# Patient Record
Sex: Female | Born: 1955 | Race: White | Hispanic: No | Marital: Single | Smoking: Never smoker
Health system: Southern US, Community
[De-identification: ages and names within clinical notes are randomized; demographics above are authoritative.]

## PROBLEM LIST (undated history)

## (undated) DIAGNOSIS — T4145XA Adverse effect of unspecified anesthetic, initial encounter: Secondary | ICD-10-CM

## (undated) DIAGNOSIS — E039 Hypothyroidism, unspecified: Secondary | ICD-10-CM

## (undated) DIAGNOSIS — E785 Hyperlipidemia, unspecified: Secondary | ICD-10-CM

## (undated) DIAGNOSIS — S82409A Unspecified fracture of shaft of unspecified fibula, initial encounter for closed fracture: Secondary | ICD-10-CM

## (undated) DIAGNOSIS — R112 Nausea with vomiting, unspecified: Secondary | ICD-10-CM

## (undated) DIAGNOSIS — Z9889 Other specified postprocedural states: Secondary | ICD-10-CM

## (undated) DIAGNOSIS — T8859XA Other complications of anesthesia, initial encounter: Secondary | ICD-10-CM

---

## 2009-03-15 HISTORY — PX: REDUCTION MAMMAPLASTY: SUR839

## 2017-10-26 ENCOUNTER — Other Ambulatory Visit: Payer: Self-pay | Admitting: Family Medicine

## 2017-10-26 DIAGNOSIS — Z780 Asymptomatic menopausal state: Secondary | ICD-10-CM

## 2017-10-26 DIAGNOSIS — Z1231 Encounter for screening mammogram for malignant neoplasm of breast: Secondary | ICD-10-CM

## 2017-12-15 ENCOUNTER — Encounter: Payer: Self-pay | Admitting: Radiology

## 2017-12-15 ENCOUNTER — Ambulatory Visit
Admission: RE | Admit: 2017-12-15 | Discharge: 2017-12-15 | Disposition: A | Discharge status: Home / Self Care | Payer: BLUE CROSS/BLUE SHIELD | Source: Ambulatory Visit | Attending: Family Medicine | Admitting: Family Medicine

## 2017-12-15 DIAGNOSIS — Z780 Asymptomatic menopausal state: Secondary | ICD-10-CM

## 2017-12-15 DIAGNOSIS — Z1231 Encounter for screening mammogram for malignant neoplasm of breast: Secondary | ICD-10-CM

## 2018-04-21 DIAGNOSIS — S82409A Unspecified fracture of shaft of unspecified fibula, initial encounter for closed fracture: Secondary | ICD-10-CM

## 2018-04-21 HISTORY — PX: INCISION AND DRAINAGE: SHX5863

## 2018-04-21 HISTORY — DX: Unspecified fracture of shaft of unspecified fibula, initial encounter for closed fracture: S82.409A

## 2018-04-24 HISTORY — PX: FIBULA FRACTURE SURGERY: SHX947

## 2018-05-08 ENCOUNTER — Ambulatory Visit (INDEPENDENT_AMBULATORY_CARE_PROVIDER_SITE_OTHER): Payer: BLUE CROSS/BLUE SHIELD | Admitting: Plastic Surgery

## 2018-05-08 ENCOUNTER — Encounter: Payer: Self-pay | Admitting: Plastic Surgery

## 2018-05-08 VITALS — BP 121/82 | HR 65 | Temp 97.9°F | Ht 64.0 in | Wt 115.0 lb

## 2018-05-08 DIAGNOSIS — W5512XA Struck by horse, initial encounter: Secondary | ICD-10-CM

## 2018-05-08 DIAGNOSIS — S81802A Unspecified open wound, left lower leg, initial encounter: Secondary | ICD-10-CM | POA: Diagnosis not present

## 2018-05-08 NOTE — Progress Notes (Signed)
Patient ID: Joyce Bass, female    DOB: 04-Nov-1955, 63 y.o.   MRN: [id number]   Chief Complaint  Patient presents with  . Advice Only    for (L) lower leg wound  . Skin Problem    The patient is a 63 yrs old wf here for evaulation of her left leg after being stepped on by a horse. This occurred last week.   She was seen directly after the accident and treated.  Multiple sutures were applied.  She also had a fracture that was repaired from the lateral incision.  She has some loss of soft tissue and skin with necrosis noted.  It is not fully clear how much loss will be the final result.  She does not appear to be infected.  She has been treating it with a clean dressing.  She is otherwise in good health.  She has a lot of experience around horses and said the this was an accident.   Review of Systems  Constitutional: Positive for activity change. Negative for appetite change.  HENT: Negative.   Eyes: Negative.   Respiratory: Negative.   Cardiovascular: Negative.   Gastrointestinal: Negative.   Endocrine: Negative.   Genitourinary: Negative.   Musculoskeletal:       Swelling due to accident  Hematological: Negative.   Psychiatric/Behavioral: Negative.     Past Medical History:  Diagnosis Date  . Complication of anesthesia   . Fibula fracture 04/21/2018   left-horse fell on leg  . Hyperlipidemia   . Hypothyroidism    hashimoto's  . PONV (postoperative nausea and vomiting)     Past Surgical History:  Procedure Laterality Date  . FIBULA FRACTURE SURGERY Left 04/24/2018  . INCISION AND DRAINAGE Left 04/21/2018   left lef  . REDUCTION MAMMAPLASTY Bilateral 2011      Current Outpatient Medications:  .  atorvastatin (LIPITOR) 10 MG tablet, Take 1 tablet by mouth at bedtime., Disp: , Rfl:  .  acetaminophen (TYLENOL) 325 MG tablet, Take 650 mg by mouth every 6 (six) hours as needed., Disp: , Rfl:  .  Calcium Alginate-Silver (ANTIBACTERIAL ALGINATE W/SILV) 4.25"X4.25"  PADS, Apply 1 application topically daily for 12 days., Disp: 12 each, Rfl: 1 .  calcium-vitamin D (OSCAL WITH D) 500-200 MG-UNIT tablet, Take 1 tablet by mouth., Disp: , Rfl:  .  estradiol-norethindrone (ACTIVELLA) 1-0.5 MG tablet, Take 1 tablet by mouth daily., Disp: , Rfl:  .  levothyroxine (SYNTHROID, LEVOTHROID) 88 MCG tablet, Take 88 mcg by mouth daily before breakfast., Disp: , Rfl:  .  Multiple Vitamin (MULTIVITAMIN WITH MINERALS) TABS tablet, Take 1 tablet by mouth daily., Disp: , Rfl:  .  naproxen sodium (ALEVE) 220 MG tablet, Take 220 mg by mouth., Disp: , Rfl:  .  vitamin C (ASCORBIC ACID) 500 MG tablet, Take 500 mg by mouth daily., Disp: , Rfl:    Objective:   Vitals:   05/08/18 1322  BP: 121/82  Pulse: 65  Temp: 97.9 F (36.6 C)  SpO2: 99%    Physical Exam Vitals signs and nursing note reviewed.  Constitutional:      Appearance: Normal appearance.  HENT:     Head: Normocephalic.     Nose: Nose normal.     Mouth/Throat:     Mouth: Mucous membranes are moist.  Cardiovascular:     Rate and Rhythm: Normal rate.     Pulses: Normal pulses.  Pulmonary:     Effort: Pulmonary effort is normal. No respiratory  distress.  Musculoskeletal:       Legs:  Skin:    General: Skin is warm.  Neurological:     General: No focal deficit present.     Mental Status: She is alert.  Psychiatric:        Mood and Affect: Mood normal.        Thought Content: Thought content normal.        Judgment: Judgment normal.     Assessment & Plan:  Struck by horse, initial encounter  Leg wound, left, initial encounter   Recommend excision of nonviable skin and soft tissue of the left leg.  Placement of ACell and likely the VAC.  The patient is in agreement.  In the meantime she can continue using the dressings or a silver-based dressing.  Keep leg elevated as able.  We tried a silver sock but it was too tight.  Alena Bills Mohsen Odenthal, DO

## 2018-05-08 NOTE — H&P (View-Only) (Signed)
   Patient ID: Joyce Bass, female    DOB: 04/25/1955, 62 y.o.   MRN: [id number]   Chief Complaint  Patient presents with  . Advice Only    for (L) lower leg wound  . Skin Problem    The patient is a 62 yrs old wf here for evaulation of her left leg after being stepped on by a horse. This occurred last week.   She was seen directly after the accident and treated.  Multiple sutures were applied.  She also had a fracture that was repaired from the lateral incision.  She has some loss of soft tissue and skin with necrosis noted.  It is not fully clear how much loss will be the final result.  She does not appear to be infected.  She has been treating it with a clean dressing.  She is otherwise in good health.  She has a lot of experience around horses and said the this was an accident.   Review of Systems  Constitutional: Positive for activity change. Negative for appetite change.  HENT: Negative.   Eyes: Negative.   Respiratory: Negative.   Cardiovascular: Negative.   Gastrointestinal: Negative.   Endocrine: Negative.   Genitourinary: Negative.   Musculoskeletal:       Swelling due to accident  Hematological: Negative.   Psychiatric/Behavioral: Negative.     Past Medical History:  Diagnosis Date  . Complication of anesthesia   . Fibula fracture 04/21/2018   left-horse fell on leg  . Hyperlipidemia   . Hypothyroidism    hashimoto's  . PONV (postoperative nausea and vomiting)     Past Surgical History:  Procedure Laterality Date  . FIBULA FRACTURE SURGERY Left 04/24/2018  . INCISION AND DRAINAGE Left 04/21/2018   left lef  . REDUCTION MAMMAPLASTY Bilateral 2011      Current Outpatient Medications:  .  atorvastatin (LIPITOR) 10 MG tablet, Take 1 tablet by mouth at bedtime., Disp: , Rfl:  .  acetaminophen (TYLENOL) 325 MG tablet, Take 650 mg by mouth every 6 (six) hours as needed., Disp: , Rfl:  .  Calcium Alginate-Silver (ANTIBACTERIAL ALGINATE W/SILV) 4.25"X4.25"  PADS, Apply 1 application topically daily for 12 days., Disp: 12 each, Rfl: 1 .  calcium-vitamin D (OSCAL WITH D) 500-200 MG-UNIT tablet, Take 1 tablet by mouth., Disp: , Rfl:  .  estradiol-norethindrone (ACTIVELLA) 1-0.5 MG tablet, Take 1 tablet by mouth daily., Disp: , Rfl:  .  levothyroxine (SYNTHROID, LEVOTHROID) 88 MCG tablet, Take 88 mcg by mouth daily before breakfast., Disp: , Rfl:  .  Multiple Vitamin (MULTIVITAMIN WITH MINERALS) TABS tablet, Take 1 tablet by mouth daily., Disp: , Rfl:  .  naproxen sodium (ALEVE) 220 MG tablet, Take 220 mg by mouth., Disp: , Rfl:  .  vitamin C (ASCORBIC ACID) 500 MG tablet, Take 500 mg by mouth daily., Disp: , Rfl:    Objective:   Vitals:   05/08/18 1322  BP: 121/82  Pulse: 65  Temp: 97.9 F (36.6 C)  SpO2: 99%    Physical Exam Vitals signs and nursing note reviewed.  Constitutional:      Appearance: Normal appearance.  HENT:     Head: Normocephalic.     Nose: Nose normal.     Mouth/Throat:     Mouth: Mucous membranes are moist.  Cardiovascular:     Rate and Rhythm: Normal rate.     Pulses: Normal pulses.  Pulmonary:     Effort: Pulmonary effort is normal. No respiratory   distress.  Musculoskeletal:       Legs:  Skin:    General: Skin is warm.  Neurological:     General: No focal deficit present.     Mental Status: She is alert.  Psychiatric:        Mood and Affect: Mood normal.        Thought Content: Thought content normal.        Judgment: Judgment normal.     Assessment & Plan:  Struck by horse, initial encounter  Leg wound, left, initial encounter   Recommend excision of nonviable skin and soft tissue of the left leg.  Placement of ACell and likely the VAC.  The patient is in agreement.  In the meantime she can continue using the dressings or a silver-based dressing.  Keep leg elevated as able.  We tried a silver sock but it was too tight.  Joyce Bills Kasson Lamere, DO

## 2018-05-10 ENCOUNTER — Telehealth: Payer: Self-pay | Admitting: Plastic Surgery

## 2018-05-10 NOTE — Telephone Encounter (Signed)
Patient called inquiring about how to care for wound. Patient states she is now caring for the wound by herself and is not sure what to do. Patient also requesting call to schedule surgery, as she would want the surgery as soon as possible.

## 2018-05-15 ENCOUNTER — Encounter: Payer: Self-pay | Admitting: Plastic Surgery

## 2018-05-15 ENCOUNTER — Other Ambulatory Visit: Payer: Self-pay

## 2018-05-15 ENCOUNTER — Encounter (HOSPITAL_BASED_OUTPATIENT_CLINIC_OR_DEPARTMENT_OTHER): Payer: Self-pay | Admitting: *Deleted

## 2018-05-15 DIAGNOSIS — W5512XA Struck by horse, initial encounter: Secondary | ICD-10-CM | POA: Insufficient documentation

## 2018-05-15 DIAGNOSIS — S81802A Unspecified open wound, left lower leg, initial encounter: Secondary | ICD-10-CM | POA: Insufficient documentation

## 2018-05-15 MED ORDER — "ANTIBACTERIAL ALGINATE W/SILV 4.25""X4.25"" EX PADS": 1.0000 "application " | MEDICATED_PAD | Freq: Every day | CUTANEOUS | 1 refills | Status: AC

## 2018-05-15 NOTE — Telephone Encounter (Signed)
Patient called after visiting [facility]. Patient states pharmacy does not have prescription for silver bandaging. Patient asking for same to be called in.

## 2018-05-17 NOTE — Anesthesia Preprocedure Evaluation (Addendum)
Anesthesia Evaluation  Patient identified by MRN, date of birth, ID band Patient awake    Reviewed: Allergy & Precautions, H&P , NPO status , Patient's Chart, lab work & pertinent test results  History of Anesthesia Complications (+) PONV  Airway Mallampati: I  TM Distance: >3 FB Neck ROM: Full    Dental no notable dental hx. (+) Teeth Intact, Dental Advisory Given   Pulmonary neg pulmonary ROS,    Pulmonary exam normal breath sounds clear to auscultation       Cardiovascular Exercise Tolerance: Good Normal cardiovascular exam Rhythm:Regular Rate:Normal     Neuro/Psych negative neurological ROS  negative psych ROS   GI/Hepatic negative GI ROS, Neg liver ROS,   Endo/Other  Hypothyroidism   Renal/GU negative Renal ROS     Musculoskeletal   Abdominal   Peds  Hematology   Anesthesia Other Findings   Reproductive/Obstetrics                            Anesthesia Physical Anesthesia Plan  ASA: II  Anesthesia Plan: General   Post-op Pain Management:    Induction:   PONV Risk Score and Plan: 4 or greater and Treatment may vary due to age or medical condition, Scopolamine patch - Pre-op, Dexamethasone and Ondansetron  Airway Management Planned: LMA  Additional Equipment:   Intra-op Plan:   Post-operative Plan:   Informed Consent: I have reviewed the patients History and Physical, chart, labs and discussed the procedure including the risks, benefits and alternatives for the proposed anesthesia with the patient or authorized representative who has indicated his/her understanding and acceptance.     Dental advisory given  Plan Discussed with:   Anesthesia Plan Comments: (LMA)       Anesthesia Quick Evaluation

## 2018-05-18 ENCOUNTER — Ambulatory Visit (HOSPITAL_BASED_OUTPATIENT_CLINIC_OR_DEPARTMENT_OTHER): Payer: BLUE CROSS/BLUE SHIELD | Admitting: Anesthesiology

## 2018-05-18 ENCOUNTER — Other Ambulatory Visit: Payer: Self-pay

## 2018-05-18 ENCOUNTER — Encounter (HOSPITAL_BASED_OUTPATIENT_CLINIC_OR_DEPARTMENT_OTHER): Payer: Self-pay | Admitting: *Deleted

## 2018-05-18 ENCOUNTER — Encounter (HOSPITAL_BASED_OUTPATIENT_CLINIC_OR_DEPARTMENT_OTHER)
Admission: RE | Disposition: A | Discharge status: Home / Self Care | Payer: Self-pay | Source: Home / Self Care | Attending: Plastic Surgery

## 2018-05-18 ENCOUNTER — Ambulatory Visit (HOSPITAL_COMMUNITY)
Admission: RE | Admit: 2018-05-18 | Discharge: 2018-05-18 | Disposition: A | Discharge status: Home / Self Care | Payer: BLUE CROSS/BLUE SHIELD | Attending: Plastic Surgery | Admitting: Plastic Surgery

## 2018-05-18 DIAGNOSIS — W5519XD Other contact with horse, subsequent encounter: Secondary | ICD-10-CM | POA: Insufficient documentation

## 2018-05-18 DIAGNOSIS — S81802A Unspecified open wound, left lower leg, initial encounter: Secondary | ICD-10-CM

## 2018-05-18 DIAGNOSIS — E785 Hyperlipidemia, unspecified: Secondary | ICD-10-CM | POA: Insufficient documentation

## 2018-05-18 DIAGNOSIS — E063 Autoimmune thyroiditis: Secondary | ICD-10-CM | POA: Insufficient documentation

## 2018-05-18 DIAGNOSIS — Z79899 Other long term (current) drug therapy: Secondary | ICD-10-CM | POA: Insufficient documentation

## 2018-05-18 DIAGNOSIS — W5582XA Struck by other mammals, initial encounter: Secondary | ICD-10-CM

## 2018-05-18 DIAGNOSIS — S81802D Unspecified open wound, left lower leg, subsequent encounter: Secondary | ICD-10-CM | POA: Insufficient documentation

## 2018-05-18 HISTORY — DX: Hyperlipidemia, unspecified: E78.5

## 2018-05-18 HISTORY — DX: Other specified postprocedural states: Z98.890

## 2018-05-18 HISTORY — DX: Adverse effect of unspecified anesthetic, initial encounter: T41.45XA

## 2018-05-18 HISTORY — DX: Unspecified fracture of shaft of unspecified fibula, initial encounter for closed fracture: S82.409A

## 2018-05-18 HISTORY — DX: Hypothyroidism, unspecified: E03.9

## 2018-05-18 HISTORY — DX: Other complications of anesthesia, initial encounter: T88.59XA

## 2018-05-18 HISTORY — DX: Other specified postprocedural states: R11.2

## 2018-05-18 HISTORY — PX: I & D EXTREMITY: SHX5045

## 2018-05-18 SURGERY — IRRIGATION AND DEBRIDEMENT EXTREMITY
Anesthesia: General | Site: Leg Lower | Laterality: Left

## 2018-05-18 MED ORDER — SODIUM CHLORIDE 0.9 % IV SOLN
INTRAVENOUS | Status: AC
  Filled 2018-05-18: qty 500000

## 2018-05-18 MED ORDER — FENTANYL CITRATE (PF) 100 MCG/2ML IJ SOLN
50.0000 ug | INTRAMUSCULAR | Status: DC | PRN
  Administered 2018-05-18: 50 ug via INTRAVENOUS

## 2018-05-18 MED ORDER — PROMETHAZINE HCL 25 MG/ML IJ SOLN: 6.2500 mg | INTRAMUSCULAR | Status: DC | PRN

## 2018-05-18 MED ORDER — SODIUM CHLORIDE 0.9 % IV SOLN: 250.0000 mL | INTRAVENOUS | Status: DC | PRN

## 2018-05-18 MED ORDER — LACTATED RINGERS IV SOLN
INTRAVENOUS | Status: DC
  Administered 2018-05-18 (×2): via INTRAVENOUS

## 2018-05-18 MED ORDER — ACETAMINOPHEN 650 MG RE SUPP: 650.0000 mg | RECTAL | Status: DC | PRN

## 2018-05-18 MED ORDER — LIDOCAINE HCL (CARDIAC) PF 100 MG/5ML IV SOSY
PREFILLED_SYRINGE | INTRAVENOUS | Status: DC | PRN
  Administered 2018-05-18: 40 mg via INTRAVENOUS

## 2018-05-18 MED ORDER — HYDROCODONE-ACETAMINOPHEN 7.5-325 MG PO TABS: 1.0000 | ORAL_TABLET | Freq: Once | ORAL | Status: DC | PRN

## 2018-05-18 MED ORDER — SODIUM CHLORIDE 0.9% FLUSH: 3.0000 mL | Freq: Two times a day (BID) | INTRAVENOUS | Status: DC

## 2018-05-18 MED ORDER — MIDAZOLAM HCL 2 MG/2ML IJ SOLN
INTRAMUSCULAR | Status: AC
  Filled 2018-05-18: qty 2

## 2018-05-18 MED ORDER — MIDAZOLAM HCL 2 MG/2ML IJ SOLN
1.0000 mg | INTRAMUSCULAR | Status: DC | PRN
  Administered 2018-05-18: 2 mg via INTRAVENOUS

## 2018-05-18 MED ORDER — ACETAMINOPHEN 325 MG PO TABS: 650.0000 mg | ORAL_TABLET | ORAL | Status: DC | PRN

## 2018-05-18 MED ORDER — PROPOFOL 10 MG/ML IV BOLUS
INTRAVENOUS | Status: DC | PRN
  Administered 2018-05-18: 120 mg via INTRAVENOUS

## 2018-05-18 MED ORDER — ACETAMINOPHEN 10 MG/ML IV SOLN: 1000.0000 mg | Freq: Once | INTRAVENOUS | Status: DC | PRN

## 2018-05-18 MED ORDER — HYDROMORPHONE HCL 1 MG/ML IJ SOLN: 0.2500 mg | INTRAMUSCULAR | Status: DC | PRN

## 2018-05-18 MED ORDER — PHENYLEPHRINE HCL 10 MG/ML IJ SOLN
INTRAMUSCULAR | Status: DC | PRN
  Administered 2018-05-18: 40 ug via INTRAVENOUS

## 2018-05-18 MED ORDER — MEPERIDINE HCL 25 MG/ML IJ SOLN: 6.2500 mg | INTRAMUSCULAR | Status: DC | PRN

## 2018-05-18 MED ORDER — CEFAZOLIN SODIUM-DEXTROSE 2-4 GM/100ML-% IV SOLN
2.0000 g | INTRAVENOUS | Status: AC
  Administered 2018-05-18: 2 g via INTRAVENOUS

## 2018-05-18 MED ORDER — PROPOFOL 10 MG/ML IV BOLUS
INTRAVENOUS | Status: AC
  Filled 2018-05-18: qty 20

## 2018-05-18 MED ORDER — LIDOCAINE 2% (20 MG/ML) 5 ML SYRINGE
INTRAMUSCULAR | Status: AC
  Filled 2018-05-18: qty 5

## 2018-05-18 MED ORDER — SCOPOLAMINE 1 MG/3DAYS TD PT72
1.0000 | MEDICATED_PATCH | Freq: Once | TRANSDERMAL | Status: AC | PRN
  Administered 2018-05-18: 1 via TRANSDERMAL

## 2018-05-18 MED ORDER — OXYCODONE HCL 5 MG PO TABS: 5.0000 mg | ORAL_TABLET | ORAL | Status: DC | PRN

## 2018-05-18 MED ORDER — ONDANSETRON HCL 4 MG/2ML IJ SOLN
INTRAMUSCULAR | Status: DC | PRN
  Administered 2018-05-18: 4 mg via INTRAVENOUS

## 2018-05-18 MED ORDER — ONDANSETRON HCL 4 MG/2ML IJ SOLN
INTRAMUSCULAR | Status: AC
  Filled 2018-05-18: qty 2

## 2018-05-18 MED ORDER — SCOPOLAMINE 1 MG/3DAYS TD PT72: 1.0000 | MEDICATED_PATCH | TRANSDERMAL | Status: DC

## 2018-05-18 MED ORDER — CEFAZOLIN SODIUM-DEXTROSE 2-4 GM/100ML-% IV SOLN
INTRAVENOUS | Status: AC
  Filled 2018-05-18: qty 100

## 2018-05-18 MED ORDER — SODIUM CHLORIDE 0.9 % IV SOLN
INTRAVENOUS | Status: DC | PRN
  Administered 2018-05-18: 500 mL

## 2018-05-18 MED ORDER — HYDROCODONE-ACETAMINOPHEN 5-325 MG PO TABS: 1.0000 | ORAL_TABLET | Freq: Four times a day (QID) | ORAL | 0 refills | Status: AC | PRN

## 2018-05-18 MED ORDER — SODIUM CHLORIDE 0.9% FLUSH: 3.0000 mL | INTRAVENOUS | Status: DC | PRN

## 2018-05-18 MED ORDER — DEXAMETHASONE SODIUM PHOSPHATE 10 MG/ML IJ SOLN
INTRAMUSCULAR | Status: AC
  Filled 2018-05-18: qty 1

## 2018-05-18 MED ORDER — DEXAMETHASONE SODIUM PHOSPHATE 4 MG/ML IJ SOLN
INTRAMUSCULAR | Status: DC | PRN
  Administered 2018-05-18: 4 mg via INTRAVENOUS

## 2018-05-18 MED ORDER — FENTANYL CITRATE (PF) 100 MCG/2ML IJ SOLN
INTRAMUSCULAR | Status: AC
  Filled 2018-05-18: qty 2

## 2018-05-18 SURGICAL SUPPLY — 78 items
BAG DECANTER FOR FLEXI CONT (MISCELLANEOUS) ×2 IMPLANT
BANDAGE ACE 3X5.8 VEL STRL LF (GAUZE/BANDAGES/DRESSINGS) ×2 IMPLANT
BANDAGE ACE 4X5 VEL STRL LF (GAUZE/BANDAGES/DRESSINGS) ×2 IMPLANT
BANDAGE ACE 6X5 VEL STRL LF (GAUZE/BANDAGES/DRESSINGS) IMPLANT
BENZOIN TINCTURE PRP APPL 2/3 (GAUZE/BANDAGES/DRESSINGS) IMPLANT
BLADE HEX COATED 2.75 (ELECTRODE) ×2 IMPLANT
BLADE MINI RND TIP GREEN BEAV (BLADE) IMPLANT
BLADE SURG 10 STRL SS (BLADE) ×2 IMPLANT
BLADE SURG 15 STRL LF DISP TIS (BLADE) ×1 IMPLANT
BLADE SURG 15 STRL SS (BLADE) ×1
BNDG COHESIVE 4X5 TAN STRL (GAUZE/BANDAGES/DRESSINGS) IMPLANT
BNDG GAUZE ELAST 4 BULKY (GAUZE/BANDAGES/DRESSINGS) ×4 IMPLANT
CANISTER SUCT 1200ML W/VALVE (MISCELLANEOUS) ×2 IMPLANT
CORD BIPOLAR FORCEPS 12FT (ELECTRODE) IMPLANT
COVER BACK TABLE 60X90IN (DRAPES) ×2 IMPLANT
COVER MAYO STAND STRL (DRAPES) ×2 IMPLANT
COVER WAND RF STERILE (DRAPES) IMPLANT
DECANTER SPIKE VIAL GLASS SM (MISCELLANEOUS) IMPLANT
DERMABOND ADVANCED (GAUZE/BANDAGES/DRESSINGS)
DERMABOND ADVANCED .7 DNX12 (GAUZE/BANDAGES/DRESSINGS) IMPLANT
DRAIN PENROSE 1/2X12 LTX STRL (WOUND CARE) IMPLANT
DRAPE INCISE IOBAN 66X45 STRL (DRAPES) IMPLANT
DRAPE U-SHAPE 76X120 STRL (DRAPES) ×2 IMPLANT
DRESSING DUODERM 4X4 STERILE (GAUZE/BANDAGES/DRESSINGS) IMPLANT
DRSG ADAPTIC 3X8 NADH LF (GAUZE/BANDAGES/DRESSINGS) IMPLANT
DRSG EMULSION OIL 3X3 NADH (GAUZE/BANDAGES/DRESSINGS) IMPLANT
DRSG PAD ABDOMINAL 8X10 ST (GAUZE/BANDAGES/DRESSINGS) ×2 IMPLANT
ELECT NEEDLE TIP 2.8 STRL (NEEDLE) IMPLANT
ELECT REM PT RETURN 9FT ADLT (ELECTROSURGICAL) ×2
ELECTRODE REM PT RTRN 9FT ADLT (ELECTROSURGICAL) ×1 IMPLANT
GAUZE SPONGE 4X4 12PLY STRL (GAUZE/BANDAGES/DRESSINGS) ×2 IMPLANT
GAUZE SPONGE 4X4 12PLY STRL LF (GAUZE/BANDAGES/DRESSINGS) IMPLANT
GLOVE BIO SURGEON STRL SZ 6.5 (GLOVE) ×6 IMPLANT
GLOVE BIOGEL PI IND STRL 7.0 (GLOVE) ×1 IMPLANT
GLOVE BIOGEL PI INDICATOR 7.0 (GLOVE) ×1
GOWN STRL REUS W/ TWL LRG LVL3 (GOWN DISPOSABLE) ×2 IMPLANT
GOWN STRL REUS W/ TWL XL LVL3 (GOWN DISPOSABLE) ×1 IMPLANT
GOWN STRL REUS W/TWL LRG LVL3 (GOWN DISPOSABLE) ×2
GOWN STRL REUS W/TWL XL LVL3 (GOWN DISPOSABLE) ×1
MATRIX WOUND MESHED 2X2 (Tissue) ×1 IMPLANT
NEEDLE HYPO 25X1 1.5 SAFETY (NEEDLE) IMPLANT
NEEDLE PRECISIONGLIDE 27X1.5 (NEEDLE) IMPLANT
NS IRRIG 1000ML POUR BTL (IV SOLUTION) ×2 IMPLANT
PACK BASIN DAY SURGERY FS (CUSTOM PROCEDURE TRAY) ×2 IMPLANT
PADDING CAST ABS 3INX4YD NS (CAST SUPPLIES)
PADDING CAST ABS 4INX4YD NS (CAST SUPPLIES)
PADDING CAST ABS COTTON 3X4 (CAST SUPPLIES) IMPLANT
PADDING CAST ABS COTTON 4X4 ST (CAST SUPPLIES) IMPLANT
PENCIL BUTTON HOLSTER BLD 10FT (ELECTRODE) ×2 IMPLANT
SHEET MEDIUM DRAPE 40X70 STRL (DRAPES) ×2 IMPLANT
SLEEVE SCD COMPRESS KNEE MED (MISCELLANEOUS) ×2 IMPLANT
SPLINT FIBERGLASS 3X35 (CAST SUPPLIES) ×2 IMPLANT
SPLINT FIBERGLASS 4X30 (CAST SUPPLIES) IMPLANT
SPLINT PLASTER CAST XFAST 3X15 (CAST SUPPLIES) IMPLANT
SPLINT PLASTER XTRA FASTSET 3X (CAST SUPPLIES)
SPONGE LAP 18X18 RF (DISPOSABLE) ×2 IMPLANT
STAPLER VISISTAT 35W (STAPLE) IMPLANT
STOCKINETTE 4X48 STRL (DRAPES) IMPLANT
STOCKINETTE 6  STRL (DRAPES)
STOCKINETTE 6 STRL (DRAPES) IMPLANT
STOCKINETTE IMPERVIOUS LG (DRAPES) IMPLANT
SUCTION FRAZIER HANDLE 10FR (MISCELLANEOUS)
SUCTION TUBE FRAZIER 10FR DISP (MISCELLANEOUS) IMPLANT
SURGILUBE 2OZ TUBE FLIPTOP (MISCELLANEOUS) ×2 IMPLANT
SUT MON AB 5-0 PS2 18 (SUTURE) ×2 IMPLANT
SUT SILK 3 0 PS 1 (SUTURE) IMPLANT
SUT VIC AB 3-0 FS2 27 (SUTURE) IMPLANT
SUT VIC AB 5-0 P-3 18X BRD (SUTURE) IMPLANT
SUT VIC AB 5-0 P3 18 (SUTURE)
SUT VIC AB 5-0 PS2 18 (SUTURE) ×4 IMPLANT
SYR BULB IRRIGATION 50ML (SYRINGE) ×2 IMPLANT
SYR CONTROL 10ML LL (SYRINGE) IMPLANT
TOWEL GREEN STERILE FF (TOWEL DISPOSABLE) ×2 IMPLANT
TRAY DSU PREP LF (CUSTOM PROCEDURE TRAY) ×2 IMPLANT
TUBE CONNECTING 20X1/4 (TUBING) ×2 IMPLANT
UNDERPAD 30X30 (UNDERPADS AND DIAPERS) ×2 IMPLANT
WOUND MATRIX MESHED 2X2 (Tissue) ×1 IMPLANT
YANKAUER SUCT BULB TIP NO VENT (SUCTIONS) ×2 IMPLANT

## 2018-05-18 NOTE — Interval H&P Note (Signed)
History and Physical Interval Note:  05/18/2018 3:10 PM  Joyce Bass  has presented today for surgery, with the diagnosis of left leg wound  The various methods of treatment have been discussed with the patient and family. After consideration of risks, benefits and other options for treatment, the patient has consented to  Procedure(s) with comments: Excision of left leg wound (Left) - adjust case length to 45 min, please with Acell (Left) and VAC placement (Left) as a surgical intervention .  The patient's history has been reviewed, patient examined, no change in status, stable for surgery.  I have reviewed the patient's chart and labs.  Questions were answered to the patient's satisfaction.     Alena Bills Dillingham

## 2018-05-18 NOTE — Op Note (Signed)
DATE OF OPERATION: 05/18/2018  LOCATION: [facility]  PREOPERATIVE DIAGNOSIS: Left leg wound from horse kick 7 x 10 cm  POSTOPERATIVE DIAGNOSIS: Same  PROCEDURE: excision of left leg wound 5 x 5 cm skin and soft tissue with placement of Integra 5 x 5 cm  SURGEON: Claire Sanger Dillingham, DO  EBL: 5 cc  CONDITION: Stable  COMPLICATIONS: None  INDICATION: The patient, Joyce Bass, is a 63 y.o. female born on July 13, 1955, is here for treatment of a crush injury to the left leg.   PROCEDURE DETAILS:  The patient was seen prior to surgery and marked.  The IV antibiotics were given. The patient was taken to the operating room and given a general anesthetic. A standard time out was performed and all information was confirmed by those in the room. SCD was placed on the right leg.   The left leg was prepped and draped.  The #10 blade was used to excise the nonviable skin and soft tissue of the 5 x 5 cm area.  The wound was full thickness.  The wound was irrigated with antiobiotic solution and saline. The 5 x 5 cm integra sheet was applied and secured with the 5-0 Vicryl.  The sutures that were placed previously were removed.  Intermittent 4-0 Monocryl were placed to prevent opening of the wound. [place] was applied with 4 x 4 cm gauze.  The leg was wrapped with kerlex and an ace wrap.  The patient was allowed to wake up and taken to recovery room in stable condition at the end of the case. The family was notified at the end of the case.

## 2018-05-18 NOTE — Anesthesia Postprocedure Evaluation (Signed)
Anesthesia Post Note  Patient: Shellee Bisson  Procedure(s) Performed: Debridement of left lower leg wound with placement of Integra (Left Leg Lower)     Patient location during evaluation: PACU Anesthesia Type: General Level of consciousness: awake and alert Pain management: pain level controlled Vital Signs Assessment: post-procedure vital signs reviewed and stable Respiratory status: spontaneous breathing, nonlabored ventilation, respiratory function stable and patient connected to nasal cannula oxygen Cardiovascular status: blood pressure returned to baseline and stable Postop Assessment: no apparent nausea or vomiting Anesthetic complications: no    Last Vitals:  Vitals:   05/18/18 1630 05/18/18 1710  BP: (!) 143/90 (!) 145/81  Pulse: 72 (!) 53  Resp: 18 16  Temp:  36.6 C  SpO2: 100% 99%    Last Pain:  Vitals:   05/18/18 1710  TempSrc:   PainSc: 1                  Trevor Iha

## 2018-05-18 NOTE — Discharge Instructions (Addendum)
INSTRUCTIONS FOR AFTER SURGERY   You are having surgery.  You will likely have some questions about what to expect following your operation.  The following information will help you and your family understand what to expect when you are discharged from the hospital.  Following these guidelines will help ensure a smooth recovery and reduce risks of complications.   Postoperative instructions include information on: diet, wound care, medications and physical activity.  AFTER SURGERY Expect to go home after the procedure.  In some cases, you may need to spend one night in the hospital for observation.  DIET This surgery does not require a specific diet.  However, I have to mention that the healthier you eat the better your body can start healing. It is important to increasing your protein intake.  This means limiting the foods with sugar and carbohydrates.  Focus on vegetables and some meat.  If you have any liposuction during your procedure be sure to drink water.  If your urine is bright yellow, then it is concentrated, and you need to drink more water.  As a general rule after surgery, you should have 8 ounces of water every hour while awake.  If you find you are persistently nauseated or unable to take in liquids let us know.  NO TOBACCO USE or EXPOSURE.  This will slow your healing process and increase the risk of a wound.  WOUND CARE Don't get the area wet for one week.  After that use fragrance free soap.  Dial, Dove and [place] are usually mild on the skin.  No baths, pools or hot tubs for two weeks. No ointment or creams on your incisions until given the go ahead.  Especially not Neosporin (Too many skin reactions with this one).  A few weeks after surgery you can use Mederma and start massaging the scar. We ask you to wear your binder or sports bra for the first 6 weeks around the clock, including while sleeping. This provides added comfort and helps reduce the fluid accumulation at the surgery  site.  ACTIVITY No heavy lifting until cleared by the doctor.  It is OK to walk and climb stairs. In fact, moving your legs is very important to decrease your risk of a blood clot.  It will also help keep you from getting deconditioned.  Every 1 to 2 hours get up and walk for 5 minutes. This will help with a quicker recovery back to normal.  Let pain be your guide so you don't do too much.  NO, you cannot do the spring cleaning and don't plan on taking care of anyone else.  This is your time for TLC.  You will be more comfortable if you sleep and rest with your head elevated either with a few pillows under you or in a recliner.  No stomach sleeping for a few months.  WORK Everyone returns to work at different times. As a rough guide, most people take at least 1 - 2 weeks off prior to returning to work. If you need documentation for your job, bring the forms to your postoperative follow up visit.  DRIVING Arrange for someone to bring you home from the hospital.  You may be able to drive a few days after surgery but not while taking any narcotics or valium.  BOWEL MOVEMENTS Constipation can occur after anesthesia and while taking pain medication.  It is important to stay ahead for your comfort.  We recommend taking Milk of Magnesia (2 tablespoons; twice  a day) while taking the pain pills.  SEROMA This is fluid your body tried to put in the surgical site.  This is normal but if it creates tight skinny skin let us know.  It usually decreases in a few weeks.  WHEN TO CALL Call your surgeon's office if any of the following occur:  Fever 101 degrees F or greater  Excessive bleeding or fluid from the incision site.  Pain that increases over time without aid from the medications  Redness, warmth, or pus draining from incision sites  Persistent nausea or inability to take in liquids  Severe misshapen area that underwent the operation.   Post Anesthesia Home Care Instructions  Activity: Get  plenty of rest for the remainder of the day. A responsible individual must stay with you for 24 hours following the procedure.  For the next 24 hours, DO NOT: -Drive a car -Advertising copywriter -Drink alcoholic beverages -Take any medication unless instructed by your physician -Make any legal decisions or sign important papers.  Meals: Start with liquid foods such as gelatin or soup. Progress to regular foods as tolerated. Avoid greasy, spicy, heavy foods. If nausea and/or vomiting occur, drink only clear liquids until the nausea and/or vomiting subsides. Call your physician if vomiting continues.  Special Instructions/Symptoms: Your throat may feel dry or sore from the anesthesia or the breathing tube placed in your throat during surgery. If this causes discomfort, gargle with warm salt water. The discomfort should disappear within 24 hours.  If you had a scopolamine patch placed behind your ear for the management of post- operative nausea and/or vomiting:  1. The medication in the patch is effective for 72 hours, after which it should be removed.  Wrap patch in a tissue and discard in the trash. Wash hands thoroughly with soap and water. 2. You may remove the patch earlier than 72 hours if you experience unpleasant side effects which may include dry mouth, dizziness or visual disturbances. 3. Avoid touching the patch. Wash your hands with soap and water after contact with the patch.

## 2018-05-18 NOTE — Transfer of Care (Signed)
Immediate Anesthesia Transfer of Care Note  Patient: Joyce Bass  Procedure(s) Performed: Debridement of left lower leg wound with placement of Integra (Left Leg Lower)  Patient Location: PACU  Anesthesia Type:General  Level of Consciousness: sedated  Airway & Oxygen Therapy: Patient Spontanous Breathing and Patient connected to face mask oxygen  Post-op Assessment: Report given to RN and Post -op Vital signs reviewed and stable  Post vital signs: Reviewed and stable  Last Vitals:  Vitals Value Taken Time  BP 103/76 05/18/2018  4:04 PM  Temp    Pulse 60 05/18/2018  4:07 PM  Resp 21 05/18/2018  4:07 PM  SpO2 100 % 05/18/2018  4:07 PM  Vitals shown include unvalidated device data.  Last Pain:  Vitals:   05/18/18 1349  TempSrc: Oral  PainSc: 0-No pain      Patients Stated Pain Goal: 0 (05/18/18 1349)  Complications: No apparent anesthesia complications

## 2018-05-18 NOTE — Anesthesia Procedure Notes (Signed)
Procedure Name: LMA Insertion Date/Time: 05/18/2018 3:19 PM Performed by: Burna Cash, CRNA Pre-anesthesia Checklist: Patient identified, Emergency Drugs available, Suction available and Patient being monitored Patient Re-evaluated:Patient Re-evaluated prior to induction Oxygen Delivery Method: Circle system utilized Preoxygenation: Pre-oxygenation with 100% oxygen Induction Type: IV induction Ventilation: Mask ventilation without difficulty LMA: LMA inserted LMA Size: 4.0 Number of attempts: 1 Airway Equipment and Method: Bite block Placement Confirmation: positive ETCO2 Tube secured with: Tape Dental Injury: Teeth and Oropharynx as per pre-operative assessment

## 2018-05-19 ENCOUNTER — Encounter (HOSPITAL_BASED_OUTPATIENT_CLINIC_OR_DEPARTMENT_OTHER): Payer: Self-pay | Admitting: Plastic Surgery

## 2018-05-19 NOTE — Progress Notes (Signed)
I called the patient this morning 05/19/18 for routine post op phone call from [facility]. She is very frustrated. She was under the impression she was going to have a wound vac in place post op. She has had trouble finding the supplies she needs for her wound care. She stated she spoke with Dr Gus Height office this morning and was told "the wound vac rep will come to her home today to apply the wound vac". She still expressed concerns of what to do after the would vac is placed, I educated the patient to follow up with with Dr Gus Height office if her questions were not answered by the wound vac rep.

## 2018-05-22 NOTE — Telephone Encounter (Signed)
Patient called expressing frustration concerning her wound care. Patient had surgery on Thursday where she believed a wound vac would be placed on her surgery site. Home health and [employer] wound vac supplier was contacted Friday. Both services have reached out to the office this morning confirming that vac was approved and that home health would be seeing the patient. Patient informed of same. Patient stated that she would wait to hear from home health.  Patient called back to inform [facility] that wound vac had arrived at home. Awaiting home health to visit patient to apply same.

## 2018-05-29 ENCOUNTER — Ambulatory Visit (INDEPENDENT_AMBULATORY_CARE_PROVIDER_SITE_OTHER): Payer: BLUE CROSS/BLUE SHIELD | Admitting: Plastic Surgery

## 2018-05-29 ENCOUNTER — Encounter: Payer: Self-pay | Admitting: Plastic Surgery

## 2018-05-29 ENCOUNTER — Other Ambulatory Visit: Payer: Self-pay

## 2018-05-29 VITALS — BP 118/77 | HR 79 | Temp 98.3°F | Ht 63.5 in | Wt 114.0 lb

## 2018-05-29 DIAGNOSIS — S81802A Unspecified open wound, left lower leg, initial encounter: Secondary | ICD-10-CM | POA: Diagnosis not present

## 2018-05-29 MED ORDER — "XEROFORM PETROLAT GAUZE 5""X9"" EX MISC": 1.0000 "application " | CUTANEOUS | 1 refills | Status: AC

## 2018-05-29 NOTE — Addendum Note (Signed)
Addended by: Peggye Form on: 05/29/2018 01:17 PM   Modules accepted: Orders

## 2018-05-29 NOTE — Progress Notes (Signed)
   Subjective:    Patient ID: Joyce Bass, female    DOB: 06/11/1955, 63 y.o.   MRN: [id number]  Patient is a 63 year old female here with her dad for follow-up on her left leg wound.  She had Integra placed.  It appears to be incorporating well.  She did not have the VAC put on as the home health nurse thought it was not needed.  Pain has improved.  Periwound swelling has improved.  The Integra was placed March 5.  No sign of infection.   Review of Systems  Constitutional: Positive for activity change.  HENT: Negative.   Respiratory: Negative.   Cardiovascular: Positive for leg swelling.  Gastrointestinal: Negative.   Genitourinary: Negative.   Musculoskeletal: Negative.   Skin: Positive for wound.  Psychiatric/Behavioral: Negative.        Objective:   Physical Exam Vitals signs and nursing note reviewed.  Constitutional:      Appearance: Normal appearance.  HENT:     Head: Normocephalic and atraumatic.  Cardiovascular:     Rate and Rhythm: Normal rate.  Pulmonary:     Effort: Pulmonary effort is normal.  Musculoskeletal:       Legs:  Neurological:     Mental Status: She is alert.  Psychiatric:        Mood and Affect: Mood normal.        Behavior: Behavior normal.        Thought Content: Thought content normal.        Judgment: Judgment normal.        Assessment & Plan:  Leg wound, left, initial encounter  Continue with Xeroform dressing changes daily. Continue with the splint per orthopedics. Plan for split-thickness skin graft and VAC placement Pictures placed in chart and surgery requested

## 2018-05-29 NOTE — H&P (View-Only) (Signed)
   Subjective:    Patient ID: Joyce Bass, female    DOB: 04/22/1955, 62 y.o.   MRN: [id number]  Patient is a 62-year-old female here with her dad for follow-up on her left leg wound.  Joyce Bass had Integra placed.  It appears to be incorporating well.  Joyce Bass did not have the VAC put on as the home health nurse thought it was not needed.  Pain has improved.  Periwound swelling has improved.  The Integra was placed March 5.  No sign of infection.   Review of Systems  Constitutional: Positive for activity change.  HENT: Negative.   Respiratory: Negative.   Cardiovascular: Positive for leg swelling.  Gastrointestinal: Negative.   Genitourinary: Negative.   Musculoskeletal: Negative.   Skin: Positive for wound.  Psychiatric/Behavioral: Negative.        Objective:   Physical Exam Vitals signs and nursing note reviewed.  Constitutional:      Appearance: Normal appearance.  HENT:     Head: Normocephalic and atraumatic.  Cardiovascular:     Rate and Rhythm: Normal rate.  Pulmonary:     Effort: Pulmonary effort is normal.  Musculoskeletal:       Legs:  Neurological:     Mental Status: Joyce Bass is alert.  Psychiatric:        Mood and Affect: Mood normal.        Behavior: Behavior normal.        Thought Content: Thought content normal.        Judgment: Judgment normal.        Assessment & Plan:  Leg wound, left, initial encounter  Continue with Xeroform dressing changes daily. Continue with the splint per orthopedics. Plan for split-thickness skin graft and VAC placement Pictures placed in chart and surgery requested 

## 2018-05-31 NOTE — Telephone Encounter (Signed)
orders faxed to [facility] by [facility] [place] on 05/19/2018  [facility]

## 2018-06-01 ENCOUNTER — Telehealth: Payer: Self-pay | Admitting: Plastic Surgery

## 2018-06-01 NOTE — Telephone Encounter (Signed)
Patient called inquiring about possible skin graft procedure. Patient states that she would like procedure on Monday (06/05/2018). Informed patient that we received her codes yesterday and would be filing to her insurance, but have not gotten procedure approved as of yet. Also informed patient that due to COVID-19, all elective procedures are being held at this time. Patient inquired as to why she was considered elective. Informed patient that she would received call from surgery scheduling as soon as it would be possible to schedule procedure. Patient also concerned if she should cancel her ortho appointment due to the skin graft procedure not being scheduled yet. Informed patient that clinical staff would be reaching out to patient about same as well as wound care during this time. Patient states she would be waiting for phone calls.

## 2018-06-01 NOTE — Telephone Encounter (Signed)
The patient was contacted and advised that we are doing our best to ensure that any surgery is scheduled as soon as possible, but that we must abide by the restrictions mandated by the hospital. Cases are being reviewed by the surgeons and will be communicated as to the urgency and timeline needed for each patient. The patient was informed that we will be in contact once we have a more definitive timeline for her surgery. She acknowledged understanding and will await a call from clinical staff to advise on wound care protocol in the interim.

## 2018-06-05 ENCOUNTER — Telehealth: Payer: Self-pay

## 2018-06-05 NOTE — Telephone Encounter (Signed)
Call from pt to request advice regarding wound vac & ortho appointment  Home health is scheduled to come today & pt is asking should wound vac be placed  & she has an appt with Dr. Renaye Rakers (ortho) on 06/12/18- pt want s to know if she can resume weight bearing on this appoint  I consulted with Dr. Ulice Bold- she approved both plans of care  Pt has a f/u appoint with Dr. Ulice Bold on 07/04/18 @ 11:15am  She will call for any concerns  [employer]

## 2018-06-05 NOTE — Telephone Encounter (Signed)
Call from pt- to request orders be faxed to [facility] regarding wound vac placement & dressing changes  Wound vac orders- place wound vac today & change 2x/week for 1 month or until otherwise ordered  Pt will return to office on 07/04/18

## 2018-06-05 NOTE — Telephone Encounter (Signed)
Call from [facility]- Supervisor- Laney Potash: she is calling to report that pt's wound has improved - measurements are 1x 2 cm & is superficial It is in her opinion that a wound vac wound not be advisable She states that the pt has been dressing the wound with xeroform, ky gel & gauze- I instructed her to continue this -  I will consult with Dr. Layla Barter to see if any changes need to be made to the plan of care  [facility]

## 2018-06-08 ENCOUNTER — Other Ambulatory Visit: Payer: Self-pay

## 2018-06-08 ENCOUNTER — Telehealth: Payer: Self-pay | Admitting: Plastic Surgery

## 2018-06-08 NOTE — Telephone Encounter (Signed)
Waynetta Sandy, RN for [facility] called regarding patient. Waynetta Sandy states that she will need orders for the patient due to the patient having surgery on 06/14/2018. Beth requesting call back as to what to do for the patient prior to and after surgery. Please contact at [phone] and advise.

## 2018-06-08 NOTE — Telephone Encounter (Signed)
Call back to Home Health - Beth, RN to give verbal orders  Per Dr. Kittie Plater request- to continue wound/dressing care - xeroform, gauze & wrap with Kerlix (x3 weeks) They can leave the "Integra" in place & Dr. Ulice Bold will remove it at time of surgery  Home Health & or pt will call for any concerns or questions  [facility]

## 2018-06-14 ENCOUNTER — Ambulatory Visit (HOSPITAL_BASED_OUTPATIENT_CLINIC_OR_DEPARTMENT_OTHER): Payer: BLUE CROSS/BLUE SHIELD | Admitting: Anesthesiology

## 2018-06-14 ENCOUNTER — Other Ambulatory Visit: Payer: Self-pay

## 2018-06-14 ENCOUNTER — Ambulatory Visit (HOSPITAL_BASED_OUTPATIENT_CLINIC_OR_DEPARTMENT_OTHER)
Admission: RE | Admit: 2018-06-14 | Discharge: 2018-06-14 | Disposition: A | Discharge status: Home / Self Care | Payer: BLUE CROSS/BLUE SHIELD | Attending: Plastic Surgery | Admitting: Plastic Surgery

## 2018-06-14 ENCOUNTER — Encounter (HOSPITAL_BASED_OUTPATIENT_CLINIC_OR_DEPARTMENT_OTHER): Payer: Self-pay | Admitting: Anesthesiology

## 2018-06-14 ENCOUNTER — Encounter (HOSPITAL_BASED_OUTPATIENT_CLINIC_OR_DEPARTMENT_OTHER)
Admission: RE | Disposition: A | Discharge status: Home / Self Care | Payer: Self-pay | Source: Home / Self Care | Attending: Plastic Surgery

## 2018-06-14 DIAGNOSIS — E039 Hypothyroidism, unspecified: Secondary | ICD-10-CM | POA: Insufficient documentation

## 2018-06-14 DIAGNOSIS — X58XXXD Exposure to other specified factors, subsequent encounter: Secondary | ICD-10-CM | POA: Diagnosis not present

## 2018-06-14 DIAGNOSIS — S81802A Unspecified open wound, left lower leg, initial encounter: Secondary | ICD-10-CM

## 2018-06-14 DIAGNOSIS — S81802D Unspecified open wound, left lower leg, subsequent encounter: Secondary | ICD-10-CM | POA: Insufficient documentation

## 2018-06-14 HISTORY — PX: SKIN SPLIT GRAFT: SHX444

## 2018-06-14 HISTORY — PX: APPLICATION OF WOUND VAC: SHX5189

## 2018-06-14 SURGERY — APPLICATION, GRAFT, SKIN, SPLIT-THICKNESS
Anesthesia: General | Site: Leg Lower | Laterality: Left

## 2018-06-14 MED ORDER — ONDANSETRON HCL 4 MG/2ML IJ SOLN: 4.0000 mg | Freq: Once | INTRAMUSCULAR | Status: DC | PRN

## 2018-06-14 MED ORDER — MIDAZOLAM HCL 2 MG/2ML IJ SOLN
INTRAMUSCULAR | Status: AC
  Filled 2018-06-14: qty 2

## 2018-06-14 MED ORDER — SODIUM CHLORIDE 0.9 % IV SOLN
INTRAVENOUS | Status: AC
  Filled 2018-06-14: qty 500000

## 2018-06-14 MED ORDER — EPHEDRINE 5 MG/ML INJ
INTRAVENOUS | Status: AC
  Filled 2018-06-14: qty 10

## 2018-06-14 MED ORDER — FENTANYL CITRATE (PF) 100 MCG/2ML IJ SOLN
INTRAMUSCULAR | Status: AC
  Filled 2018-06-14: qty 2

## 2018-06-14 MED ORDER — BUPIVACAINE-EPINEPHRINE (PF) 0.25% -1:200000 IJ SOLN
INTRAMUSCULAR | Status: DC | PRN
  Administered 2018-06-14: 10 mL via PERINEURAL

## 2018-06-14 MED ORDER — ACETAMINOPHEN 160 MG/5ML PO SOLN: 325.0000 mg | ORAL | Status: DC | PRN

## 2018-06-14 MED ORDER — CHLORHEXIDINE GLUCONATE CLOTH 2 % EX PADS: 6.0000 | MEDICATED_PAD | Freq: Once | CUTANEOUS | Status: DC

## 2018-06-14 MED ORDER — SUCCINYLCHOLINE CHLORIDE 200 MG/10ML IV SOSY
PREFILLED_SYRINGE | INTRAVENOUS | Status: AC
  Filled 2018-06-14: qty 10

## 2018-06-14 MED ORDER — DEXAMETHASONE SODIUM PHOSPHATE 10 MG/ML IJ SOLN
INTRAMUSCULAR | Status: AC
  Filled 2018-06-14: qty 1

## 2018-06-14 MED ORDER — DEXAMETHASONE SODIUM PHOSPHATE 4 MG/ML IJ SOLN
INTRAMUSCULAR | Status: DC | PRN
  Administered 2018-06-14: 4 mg via INTRAVENOUS

## 2018-06-14 MED ORDER — OXYCODONE HCL 5 MG/5ML PO SOLN: 5.0000 mg | Freq: Once | ORAL | Status: DC | PRN

## 2018-06-14 MED ORDER — FENTANYL CITRATE (PF) 100 MCG/2ML IJ SOLN
50.0000 ug | INTRAMUSCULAR | Status: DC | PRN
  Administered 2018-06-14: 100 ug via INTRAVENOUS

## 2018-06-14 MED ORDER — SCOPOLAMINE 1 MG/3DAYS TD PT72: 1.0000 | MEDICATED_PATCH | Freq: Once | TRANSDERMAL | Status: DC | PRN

## 2018-06-14 MED ORDER — CEFAZOLIN SODIUM-DEXTROSE 2-4 GM/100ML-% IV SOLN
INTRAVENOUS | Status: AC
  Filled 2018-06-14: qty 100

## 2018-06-14 MED ORDER — ONDANSETRON HCL 4 MG/2ML IJ SOLN
INTRAMUSCULAR | Status: DC | PRN
  Administered 2018-06-14: 4 mg via INTRAVENOUS

## 2018-06-14 MED ORDER — FENTANYL CITRATE (PF) 100 MCG/2ML IJ SOLN: 25.0000 ug | INTRAMUSCULAR | Status: DC | PRN

## 2018-06-14 MED ORDER — MEPERIDINE HCL 25 MG/ML IJ SOLN: 6.2500 mg | INTRAMUSCULAR | Status: DC | PRN

## 2018-06-14 MED ORDER — BUPIVACAINE-EPINEPHRINE (PF) 0.5% -1:200000 IJ SOLN
INTRAMUSCULAR | Status: AC
  Filled 2018-06-14: qty 30

## 2018-06-14 MED ORDER — PHENYLEPHRINE 40 MCG/ML (10ML) SYRINGE FOR IV PUSH (FOR BLOOD PRESSURE SUPPORT)
PREFILLED_SYRINGE | INTRAVENOUS | Status: AC
  Filled 2018-06-14: qty 10

## 2018-06-14 MED ORDER — EPHEDRINE SULFATE 50 MG/ML IJ SOLN
INTRAMUSCULAR | Status: DC | PRN
  Administered 2018-06-14: 10 mg via INTRAVENOUS

## 2018-06-14 MED ORDER — BACITRACIN-NEOMYCIN-POLYMYXIN 400-5-5000 EX OINT
TOPICAL_OINTMENT | CUTANEOUS | Status: AC
  Filled 2018-06-14: qty 1

## 2018-06-14 MED ORDER — MIDAZOLAM HCL 2 MG/2ML IJ SOLN
1.0000 mg | INTRAMUSCULAR | Status: DC | PRN
  Administered 2018-06-14: 13:00:00 2 mg via INTRAVENOUS

## 2018-06-14 MED ORDER — LIDOCAINE 2% (20 MG/ML) 5 ML SYRINGE
INTRAMUSCULAR | Status: AC
  Filled 2018-06-14: qty 5

## 2018-06-14 MED ORDER — CEFAZOLIN SODIUM-DEXTROSE 2-4 GM/100ML-% IV SOLN
2.0000 g | INTRAVENOUS | Status: AC
  Administered 2018-06-14 (×2): 2 g via INTRAVENOUS

## 2018-06-14 MED ORDER — ACETAMINOPHEN 325 MG PO TABS: 325.0000 mg | ORAL_TABLET | ORAL | Status: DC | PRN

## 2018-06-14 MED ORDER — OXYCODONE HCL 5 MG PO TABS: 5.0000 mg | ORAL_TABLET | Freq: Once | ORAL | Status: DC | PRN

## 2018-06-14 MED ORDER — ONDANSETRON HCL 4 MG/2ML IJ SOLN
INTRAMUSCULAR | Status: AC
  Filled 2018-06-14: qty 2

## 2018-06-14 MED ORDER — LACTATED RINGERS IV SOLN
INTRAVENOUS | Status: DC
  Administered 2018-06-14: 12:00:00 via INTRAVENOUS

## 2018-06-14 SURGICAL SUPPLY — 83 items
BAG DECANTER FOR FLEXI CONT (MISCELLANEOUS) IMPLANT
BANDAGE ACE 3X5.8 VEL STRL LF (GAUZE/BANDAGES/DRESSINGS) IMPLANT
BANDAGE ACE 4X5 VEL STRL LF (GAUZE/BANDAGES/DRESSINGS) ×4 IMPLANT
BANDAGE ACE 6X5 VEL STRL LF (GAUZE/BANDAGES/DRESSINGS) IMPLANT
BENZOIN TINCTURE PRP APPL 2/3 (GAUZE/BANDAGES/DRESSINGS) IMPLANT
BLADE CLIPPER SURG (BLADE) IMPLANT
BLADE DERMATOME SS (BLADE) ×2 IMPLANT
BLADE HEX COATED 2.75 (ELECTRODE) ×2 IMPLANT
BLADE MINI RND TIP GREEN BEAV (BLADE) IMPLANT
BLADE SURG 10 STRL SS (BLADE) IMPLANT
BLADE SURG 15 STRL LF DISP TIS (BLADE) ×1 IMPLANT
BLADE SURG 15 STRL SS (BLADE) ×1
BNDG COHESIVE 4X5 TAN STRL (GAUZE/BANDAGES/DRESSINGS) IMPLANT
BNDG GAUZE ELAST 4 BULKY (GAUZE/BANDAGES/DRESSINGS) ×4 IMPLANT
CANISTER SUCT 1200ML W/VALVE (MISCELLANEOUS) IMPLANT
CORD BIPOLAR FORCEPS 12FT (ELECTRODE) IMPLANT
COTTONBALL LRG STERILE PKG (GAUZE/BANDAGES/DRESSINGS) IMPLANT
COVER BACK TABLE REUSABLE LG (DRAPES) ×2 IMPLANT
COVER MAYO STAND REUSABLE (DRAPES) ×2 IMPLANT
COVER WAND RF STERILE (DRAPES) IMPLANT
DECANTER SPIKE VIAL GLASS SM (MISCELLANEOUS) IMPLANT
DERMABOND ADVANCED (GAUZE/BANDAGES/DRESSINGS)
DERMABOND ADVANCED .7 DNX12 (GAUZE/BANDAGES/DRESSINGS) IMPLANT
DERMACARRIERS GRAFT 1 TO 1.5 (DISPOSABLE)
DRAIN PENROSE 1/2X12 LTX STRL (WOUND CARE) IMPLANT
DRAPE INCISE IOBAN 66X45 STRL (DRAPES) IMPLANT
DRAPE U-SHAPE 76X120 STRL (DRAPES) ×4 IMPLANT
DRESSING DUODERM 4X4 STERILE (GAUZE/BANDAGES/DRESSINGS) IMPLANT
DRSG ADAPTIC 3X8 NADH LF (GAUZE/BANDAGES/DRESSINGS) ×2 IMPLANT
DRSG EMULSION OIL 3X3 NADH (GAUZE/BANDAGES/DRESSINGS) IMPLANT
DRSG OPSITE 6X11 MED (GAUZE/BANDAGES/DRESSINGS) IMPLANT
DRSG PAD ABDOMINAL 8X10 ST (GAUZE/BANDAGES/DRESSINGS) ×2 IMPLANT
DRSG TEGADERM 4X10 (GAUZE/BANDAGES/DRESSINGS) IMPLANT
ELECT NEEDLE TIP 2.8 STRL (NEEDLE) IMPLANT
ELECT REM PT RETURN 9FT ADLT (ELECTROSURGICAL) ×2
ELECTRODE REM PT RTRN 9FT ADLT (ELECTROSURGICAL) ×1 IMPLANT
GAUZE SPONGE 4X4 12PLY STRL (GAUZE/BANDAGES/DRESSINGS) ×2 IMPLANT
GAUZE SPONGE 4X4 12PLY STRL LF (GAUZE/BANDAGES/DRESSINGS) IMPLANT
GAUZE XEROFORM 5X9 LF (GAUZE/BANDAGES/DRESSINGS) ×2 IMPLANT
GLOVE BIO SURGEON STRL SZ 6.5 (GLOVE) ×6 IMPLANT
GOWN STRL REUS W/ TWL LRG LVL3 (GOWN DISPOSABLE) ×3 IMPLANT
GOWN STRL REUS W/TWL LRG LVL3 (GOWN DISPOSABLE) ×3
GRAFT DERMACARRIERS 1 TO 1.5 (DISPOSABLE) IMPLANT
NEEDLE HYPO 25X1 1.5 SAFETY (NEEDLE) ×2 IMPLANT
NEEDLE PRECISIONGLIDE 27X1.5 (NEEDLE) IMPLANT
NS IRRIG 1000ML POUR BTL (IV SOLUTION) ×2 IMPLANT
PACK BASIN DAY SURGERY FS (CUSTOM PROCEDURE TRAY) ×2 IMPLANT
PADDING CAST ABS 3INX4YD NS (CAST SUPPLIES)
PADDING CAST ABS 4INX4YD NS (CAST SUPPLIES)
PADDING CAST ABS COTTON 3X4 (CAST SUPPLIES) IMPLANT
PADDING CAST ABS COTTON 4X4 ST (CAST SUPPLIES) IMPLANT
PENCIL BUTTON HOLSTER BLD 10FT (ELECTRODE) ×2 IMPLANT
SHEET MEDIUM DRAPE 40X70 STRL (DRAPES) ×2 IMPLANT
SLEEVE SCD COMPRESS KNEE MED (MISCELLANEOUS) IMPLANT
SPLINT FIBERGLASS 3X35 (CAST SUPPLIES) ×2 IMPLANT
SPLINT FIBERGLASS 4X30 (CAST SUPPLIES) IMPLANT
SPLINT PLASTER CAST XFAST 3X15 (CAST SUPPLIES) IMPLANT
SPLINT PLASTER XTRA FASTSET 3X (CAST SUPPLIES)
SPONGE LAP 18X18 RF (DISPOSABLE) ×4 IMPLANT
STAPLER VISISTAT 35W (STAPLE) IMPLANT
STOCKINETTE 4X48 STRL (DRAPES) IMPLANT
STOCKINETTE 6  STRL (DRAPES) ×1
STOCKINETTE 6 STRL (DRAPES) ×1 IMPLANT
STOCKINETTE IMPERVIOUS LG (DRAPES) IMPLANT
SUCTION FRAZIER HANDLE 10FR (MISCELLANEOUS)
SUCTION TUBE FRAZIER 10FR DISP (MISCELLANEOUS) IMPLANT
SURGILUBE 2OZ TUBE FLIPTOP (MISCELLANEOUS) IMPLANT
SUT MON AB 5-0 PS2 18 (SUTURE) IMPLANT
SUT SILK 3 0 PS 1 (SUTURE) IMPLANT
SUT SILK 3 0 SH CR/8 (SUTURE) IMPLANT
SUT SILK 4 0 PS 2 (SUTURE) IMPLANT
SUT VIC AB 3-0 FS2 27 (SUTURE) IMPLANT
SUT VIC AB 5-0 P-3 18X BRD (SUTURE) IMPLANT
SUT VIC AB 5-0 P3 18 (SUTURE)
SUT VIC AB 5-0 PS2 18 (SUTURE) ×4 IMPLANT
SUT VICRYL 4-0 PS2 18IN ABS (SUTURE) IMPLANT
SYR BULB IRRIGATION 50ML (SYRINGE) ×2 IMPLANT
SYR CONTROL 10ML LL (SYRINGE) ×2 IMPLANT
TOWEL GREEN STERILE FF (TOWEL DISPOSABLE) ×2 IMPLANT
TRAY DSU PREP LF (CUSTOM PROCEDURE TRAY) ×2 IMPLANT
TUBE CONNECTING 20X1/4 (TUBING) IMPLANT
UNDERPAD 30X30 (UNDERPADS AND DIAPERS) ×2 IMPLANT
YANKAUER SUCT BULB TIP NO VENT (SUCTIONS) ×2 IMPLANT

## 2018-06-14 NOTE — Interval H&P Note (Signed)
History and Physical Interval Note:  06/14/2018 12:37 PM  Joyce Bass  has presented today for surgery, with the diagnosis of left leg wound.  The various methods of treatment have been discussed with the patient and family. After consideration of risks, benefits and other options for treatment, the patient has consented to  Procedure(s) with comments: Split-thickness skin graft to left leg (Left) - 1 hour please With Wound Vac placement (Left) as a surgical intervention.  The patient's history has been reviewed, patient examined, no change in status, stable for surgery.  I have reviewed the patient's chart and labs.  Questions were answered to the patient's satisfaction.     Alena Bills [place]

## 2018-06-14 NOTE — Discharge Instructions (Addendum)
Do not get leg wet Do not remove VAC or disconnect VAC Can change outside gauze dressing on thigh daily if needed but leave yellow xeroform in place   Post Anesthesia Home Care Instructions  Activity: Get plenty of rest for the remainder of the day. A responsible individual must stay with you for 24 hours following the procedure.  For the next 24 hours, DO NOT: -Drive a car -Advertising copywriter -Drink alcoholic beverages -Take any medication unless instructed by your physician -Make any legal decisions or sign important papers.  Meals: Start with liquid foods such as gelatin or soup. Progress to regular foods as tolerated. Avoid greasy, spicy, heavy foods. If nausea and/or vomiting occur, drink only clear liquids until the nausea and/or vomiting subsides. Call your physician if vomiting continues.  Special Instructions/Symptoms: Your throat may feel dry or sore from the anesthesia or the breathing tube placed in your throat during surgery. If this causes discomfort, gargle with warm salt water. The discomfort should disappear within 24 hours.  If you had a scopolamine patch placed behind your ear for the management of post- operative nausea and/or vomiting:  1. The medication in the patch is effective for 72 hours, after which it should be removed.  Wrap patch in a tissue and discard in the trash. Wash hands thoroughly with soap and water. 2. You may remove the patch earlier than 72 hours if you experience unpleasant side effects which may include dry mouth, dizziness or visual disturbances. 3. Avoid touching the patch. Wash your hands with soap and water after contact with the patch.

## 2018-06-14 NOTE — Anesthesia Postprocedure Evaluation (Signed)
Anesthesia Post Note  Patient: Carolee Erekson  Procedure(s) Performed: Split-thickness skin graft to left leg (Left Leg Lower) With Wound Vac placement (Left Leg Lower)     Patient location during evaluation: PACU Anesthesia Type: General Level of consciousness: awake and alert Pain management: pain level controlled Vital Signs Assessment: post-procedure vital signs reviewed and stable Respiratory status: spontaneous breathing, nonlabored ventilation, respiratory function stable and patient connected to nasal cannula oxygen Cardiovascular status: blood pressure returned to baseline and stable Postop Assessment: no apparent nausea or vomiting Anesthetic complications: no    Last Vitals:  Vitals:   06/14/18 1415 06/14/18 1425  BP: 124/69 125/75  Pulse: 71 80  Resp: 14 14  Temp:    SpO2: 97% 99%    Last Pain:  Vitals:   06/14/18 1425  TempSrc:   PainSc: 0-No pain                 Santi Troung

## 2018-06-14 NOTE — Anesthesia Procedure Notes (Signed)
Procedure Name: LMA Insertion Date/Time: 06/14/2018 1:04 PM Performed by: Ronnette Hila, CRNA Pre-anesthesia Checklist: Patient identified, Emergency Drugs available, Suction available and Patient being monitored Patient Re-evaluated:Patient Re-evaluated prior to induction Oxygen Delivery Method: Circle system utilized Preoxygenation: Pre-oxygenation with 100% oxygen Induction Type: IV induction Ventilation: Mask ventilation without difficulty LMA: LMA inserted LMA Size: 3.0 Number of attempts: 1 Airway Equipment and Method: Bite block Placement Confirmation: positive ETCO2 Tube secured with: Tape Dental Injury: Teeth and Oropharynx as per pre-operative assessment

## 2018-06-14 NOTE — Anesthesia Preprocedure Evaluation (Signed)
Anesthesia Evaluation  Patient identified by MRN, date of birth, ID band Patient awake    Reviewed: Allergy & Precautions, H&P , NPO status , Patient's Chart, lab work & pertinent test results  History of Anesthesia Complications (+) PONV and history of anesthetic complications  Airway Mallampati: I  TM Distance: >3 FB Neck ROM: Full    Dental no notable dental hx. (+) Teeth Intact, Dental Advisory Given   Pulmonary neg pulmonary ROS,    Pulmonary exam normal breath sounds clear to auscultation       Cardiovascular Exercise Tolerance: Good Normal cardiovascular exam Rhythm:Regular Rate:Normal     Neuro/Psych negative neurological ROS  negative psych ROS   GI/Hepatic negative GI ROS, Neg liver ROS,   Endo/Other  Hypothyroidism   Renal/GU negative Renal ROS     Musculoskeletal   Abdominal   Peds  Hematology   Anesthesia Other Findings   Reproductive/Obstetrics                             Anesthesia Physical  Anesthesia Plan  ASA: II  Anesthesia Plan: General   Post-op Pain Management:    Induction:   PONV Risk Score and Plan: 4 or greater and Treatment may vary due to age or medical condition, Scopolamine patch - Pre-op, Dexamethasone and Ondansetron  Airway Management Planned: LMA  Additional Equipment:   Intra-op Plan:   Post-operative Plan:   Informed Consent: I have reviewed the patients History and Physical, chart, labs and discussed the procedure including the risks, benefits and alternatives for the proposed anesthesia with the patient or authorized representative who has indicated his/her understanding and acceptance.     Dental advisory given  Plan Discussed with: CRNA, Anesthesiologist and Surgeon  Anesthesia Plan Comments: (LMA)        Anesthesia Quick Evaluation

## 2018-06-14 NOTE — Op Note (Signed)
DATE OF OPERATION: 06/14/2018  LOCATION: [facility]  PREOPERATIVE DIAGNOSIS: left leg wound  POSTOPERATIVE DIAGNOSIS: Same  PROCEDURE: split thickness skin graft 3 x 6.5 cm to left leg with VAC placement   SURGEON: Alan Ripper Sanger Thersea Manfredonia, DO  ASSISTANT: Enedina Finner, RNFA  EBL: none  CONDITION: Stable  COMPLICATIONS: None  INDICATION: The patient, Joyce Bass, is a 63 y.o. female born on 06-28-1955, is here for treatment of a left leg wound from a horse accident.   PROCEDURE DETAILS:  The patient was seen prior to surgery and marked.  The IV antibiotics were given. The patient was taken to the operating room and given a general anesthetic. A standard time out was performed and all information was confirmed by those in the room. SCD was placed on the right leg.   The leg was irrigated with saline.  The integra silicone sheet was removed.  There was very nice granulation tissue formation.  No sign of infection.  The dermatome was set at 02/999 th inch and the left upper thigh used to harvest the split thickness skin graft.  The skin was then applied to the 3 x 6.5 cm area.  It was secured with the 5-0 Vicryl.  The adaptic was placed over the graft.  The vac was applied at had excellent pressure.  The leg was wrapped with kerlex and then splinted.  An ace wrap was applied.   The patient was allowed to wake up and taken to recovery room in stable condition at the end of the case. The family was notified at the end of the case.   The RNFA assisted throughout the case.  The RNFA was essential in retraction and counter traction when needed to make the case progress smoothly.  This retraction and assistance made it possible to see the tissue plans for the procedure.  The assistance was needed for blood control, tissue re-approximation and assisted with closure of the incision site.

## 2018-06-14 NOTE — Transfer of Care (Signed)
Immediate Anesthesia Transfer of Care Note  Patient: Patia Breidinger  Procedure(s) Performed: Split-thickness skin graft to left leg (Left Leg Lower) With Wound Vac placement (Left Leg Lower)  Patient Location: PACU  Anesthesia Type:General  Level of Consciousness: awake, alert , oriented and drowsy  Airway & Oxygen Therapy: Patient Spontanous Breathing and Patient connected to face mask oxygen  Post-op Assessment: Report given to RN and Post -op Vital signs reviewed and stable  Post vital signs: Reviewed and stable  Last Vitals:  Vitals Value Taken Time  BP    Temp    Pulse    Resp 13 06/14/2018  1:53 PM  SpO2    Vitals shown include unvalidated device data.  Last Pain:  Vitals:   06/14/18 1151  TempSrc: Oral  PainSc: 0-No pain      Patients Stated Pain Goal: 0 (06/14/18 1151)  Complications: No apparent anesthesia complications

## 2018-06-15 ENCOUNTER — Encounter (HOSPITAL_BASED_OUTPATIENT_CLINIC_OR_DEPARTMENT_OTHER): Payer: Self-pay | Admitting: Plastic Surgery

## 2018-06-15 NOTE — Addendum Note (Signed)
Addendum  created 06/15/18 1312 by Lance Coon, CRNA   Charge Capture section accepted

## 2018-06-21 ENCOUNTER — Telehealth: Payer: Self-pay | Admitting: Plastic Surgery

## 2018-06-21 NOTE — Telephone Encounter (Signed)
Called patient to confirm appointment scheduled for tomorrow. Patient answered the following questions: °1.Has the patient traveled outside of the state of [place] at all within the past 6 weeks? No °2.Does the patient have a fever or cough at all? No °3.Has the patient been tested for COVID? Had a positive COVID test? No °4. Has the patient been in contact with anyone who has tested positive? No ° °

## 2018-06-22 ENCOUNTER — Encounter: Payer: Self-pay | Admitting: Plastic Surgery

## 2018-06-22 ENCOUNTER — Ambulatory Visit (INDEPENDENT_AMBULATORY_CARE_PROVIDER_SITE_OTHER): Payer: BLUE CROSS/BLUE SHIELD | Admitting: Plastic Surgery

## 2018-06-22 ENCOUNTER — Other Ambulatory Visit: Payer: Self-pay

## 2018-06-22 VITALS — BP 120/78 | HR 65 | Temp 97.9°F | Ht 63.0 in | Wt 114.0 lb

## 2018-06-22 DIAGNOSIS — S81802A Unspecified open wound, left lower leg, initial encounter: Secondary | ICD-10-CM

## 2018-06-22 NOTE — Progress Notes (Signed)
The patient is a 63 year old female here for follow-up on her left leg skin graft.  She has been wearing the splint with the VAC.  Today the skin graft appears to have 100% take at this point in time.  The donor site is clean and healthy.  There is no sign of infection.  Recommend continuing with the splint for 1 more week and then Xeroform.  She can change the Xeroform every other day.  Vaseline to the donor site.  She can come out of the splint in 1 week and start to shower.  After that another week of the Xeroform.  She needs to check with Dr. Eulah Pont on the weightbearing and splint that he gave her.  We will do a virtual visit in 1 week.

## 2018-06-29 ENCOUNTER — Other Ambulatory Visit: Payer: Self-pay

## 2018-06-29 ENCOUNTER — Ambulatory Visit (INDEPENDENT_AMBULATORY_CARE_PROVIDER_SITE_OTHER): Payer: BLUE CROSS/BLUE SHIELD | Admitting: Plastic Surgery

## 2018-06-29 ENCOUNTER — Encounter: Payer: Self-pay | Admitting: Plastic Surgery

## 2018-06-29 DIAGNOSIS — S81802A Unspecified open wound, left lower leg, initial encounter: Secondary | ICD-10-CM

## 2018-06-29 NOTE — Progress Notes (Signed)
   Subjective:    Patient ID: Joyce Bass, female    DOB: Nov 30, 1955, 63 y.o.   MRN: [id number]  The patient is a 63 yrs wf on phone for virtual visit for her left leg.  She had an injury from a horse with tissue loss.  She underwent debridement and skin graft 4/1.  She is very pleased with her progress.  The graft appears to have excellent take.  No sign of infection.     Review of Systems  Constitutional: Positive for activity change.  HENT: Negative.   Eyes: Negative.   Respiratory: Negative.   Cardiovascular: Negative.   Gastrointestinal: Negative.   Genitourinary: Negative.   Musculoskeletal: Negative.   Hematological: Negative.   Psychiatric/Behavioral: Negative.        Objective:   Physical Exam Neurological:     Mental Status: She is alert and oriented to person, place, and time.  Psychiatric:        Mood and Affect: Mood normal.        Behavior: Behavior normal.       Assessment & Plan:  Leg wound, left, initial encounter By scheduling this appointment and accepting meeting code, patient gave consent to a virtual video visit. This is also granting permission to assess and treat via this visit. She can come out of the splint.  Continue elevation and ace wrap with xeroform for one more week.  Then compression sock.   Follow up as needed.

## 2018-07-04 ENCOUNTER — Ambulatory Visit: Payer: BLUE CROSS/BLUE SHIELD | Admitting: Plastic Surgery

## 2019-09-03 ENCOUNTER — Other Ambulatory Visit: Payer: Self-pay | Admitting: Family Medicine

## 2019-09-03 DIAGNOSIS — Z1231 Encounter for screening mammogram for malignant neoplasm of breast: Secondary | ICD-10-CM

## 2019-09-05 ENCOUNTER — Other Ambulatory Visit: Payer: Self-pay

## 2019-09-05 ENCOUNTER — Ambulatory Visit
Admission: RE | Admit: 2019-09-05 | Discharge: 2019-09-05 | Disposition: A | Discharge status: Home / Self Care | Payer: BC Managed Care – PPO | Source: Ambulatory Visit | Attending: Family Medicine | Admitting: Family Medicine

## 2019-09-05 DIAGNOSIS — Z1231 Encounter for screening mammogram for malignant neoplasm of breast: Secondary | ICD-10-CM

## 2020-08-04 ENCOUNTER — Other Ambulatory Visit: Payer: Self-pay | Admitting: Family Medicine

## 2020-08-04 DIAGNOSIS — Z1231 Encounter for screening mammogram for malignant neoplasm of breast: Secondary | ICD-10-CM

## 2020-09-09 ENCOUNTER — Other Ambulatory Visit: Payer: Self-pay | Admitting: Family Medicine

## 2020-09-09 DIAGNOSIS — E038 Other specified hypothyroidism: Secondary | ICD-10-CM | POA: Diagnosis not present

## 2020-09-09 DIAGNOSIS — Z5181 Encounter for therapeutic drug level monitoring: Secondary | ICD-10-CM | POA: Diagnosis not present

## 2020-09-09 DIAGNOSIS — M8589 Other specified disorders of bone density and structure, multiple sites: Secondary | ICD-10-CM | POA: Diagnosis not present

## 2020-09-09 DIAGNOSIS — M8580 Other specified disorders of bone density and structure, unspecified site: Secondary | ICD-10-CM

## 2020-09-09 DIAGNOSIS — E063 Autoimmune thyroiditis: Secondary | ICD-10-CM | POA: Diagnosis not present

## 2020-09-09 DIAGNOSIS — Z0000 Encounter for general adult medical examination without abnormal findings: Secondary | ICD-10-CM | POA: Diagnosis not present

## 2020-09-09 DIAGNOSIS — N959 Unspecified menopausal and perimenopausal disorder: Secondary | ICD-10-CM | POA: Diagnosis not present

## 2020-09-09 DIAGNOSIS — E785 Hyperlipidemia, unspecified: Secondary | ICD-10-CM | POA: Diagnosis not present

## 2020-09-30 ENCOUNTER — Other Ambulatory Visit: Payer: Self-pay

## 2020-09-30 ENCOUNTER — Ambulatory Visit
Admission: RE | Admit: 2020-09-30 | Discharge: 2020-09-30 | Disposition: A | Discharge status: Home / Self Care | Payer: PPO | Source: Ambulatory Visit | Attending: Family Medicine | Admitting: Family Medicine

## 2020-09-30 DIAGNOSIS — Z1231 Encounter for screening mammogram for malignant neoplasm of breast: Secondary | ICD-10-CM

## 2021-01-27 DIAGNOSIS — Z124 Encounter for screening for malignant neoplasm of cervix: Secondary | ICD-10-CM | POA: Diagnosis not present

## 2021-01-27 DIAGNOSIS — Z23 Encounter for immunization: Secondary | ICD-10-CM | POA: Diagnosis not present

## 2021-02-23 ENCOUNTER — Ambulatory Visit
Admission: RE | Admit: 2021-02-23 | Discharge: 2021-02-23 | Disposition: A | Discharge status: Home / Self Care | Payer: PPO | Source: Ambulatory Visit | Attending: Family Medicine | Admitting: Family Medicine

## 2021-02-23 ENCOUNTER — Other Ambulatory Visit: Payer: Self-pay

## 2021-02-23 DIAGNOSIS — M8580 Other specified disorders of bone density and structure, unspecified site: Secondary | ICD-10-CM

## 2021-08-17 ENCOUNTER — Other Ambulatory Visit: Payer: Self-pay | Admitting: Family Medicine

## 2021-08-17 DIAGNOSIS — Z1231 Encounter for screening mammogram for malignant neoplasm of breast: Secondary | ICD-10-CM

## 2021-08-28 ENCOUNTER — Emergency Department (HOSPITAL_BASED_OUTPATIENT_CLINIC_OR_DEPARTMENT_OTHER)
Admission: EM | Admit: 2021-08-28 | Discharge: 2021-08-28 | Disposition: A | Discharge status: Home / Self Care | Payer: PPO | Attending: Emergency Medicine | Admitting: Emergency Medicine

## 2021-08-28 ENCOUNTER — Other Ambulatory Visit: Payer: Self-pay

## 2021-08-28 ENCOUNTER — Encounter (HOSPITAL_BASED_OUTPATIENT_CLINIC_OR_DEPARTMENT_OTHER): Payer: Self-pay

## 2021-08-28 DIAGNOSIS — W5532XA Struck by other hoof stock, initial encounter: Secondary | ICD-10-CM | POA: Diagnosis not present

## 2021-08-28 DIAGNOSIS — S81812A Laceration without foreign body, left lower leg, initial encounter: Secondary | ICD-10-CM | POA: Diagnosis not present

## 2021-08-28 DIAGNOSIS — S8992XA Unspecified injury of left lower leg, initial encounter: Secondary | ICD-10-CM | POA: Diagnosis present

## 2021-08-28 NOTE — Discharge Instructions (Signed)
Follow-up with Dr. Eulah Pont on Monday to discuss the x-ray from urgent care.  Recommend changing dressing every day or every other day.  Come back to ER if you develop any redness, drainage, uncontrolled pain or other new concerning symptom.

## 2021-08-28 NOTE — ED Triage Notes (Signed)
Patient here POV from UC.  Patient states she was Side-Swiped by the Hoof of her Horse onto her Left Medial Lower Extremity at approximately 1200 today. Patient went to UC due to Bleeding and had Imaging completed.   UC would not perform Sutures due to not being able to confirm the Presence of a FB and was sent for Evaluation.   NAD Noted during Triage. A&Ox4. GCS 15. Ambulatory.

## 2021-08-28 NOTE — ED Provider Notes (Signed)
  [facility] EMERGENCY DEPT Provider Note   CSN: [id number] Arrival date & time: 08/28/21  1746     History {Add pertinent medical, surgical, social history, OB history to HPI:1} Chief Complaint  Patient presents with   Leg Injury    Joyce Bass is a 66 y.o. female.  HPI     Home Medications Prior to Admission medications   Medication Sig Start Date End Date Taking? Authorizing Provider  acetaminophen (TYLENOL) 325 MG tablet Take 650 mg by mouth every 6 (six) hours as needed.    [provider]  atorvastatin (LIPITOR) 10 MG tablet Take 1 tablet by mouth at bedtime. 07/10/17   [provider]  calcium-vitamin D (OSCAL WITH D) 500-200 MG-UNIT tablet Take 1 tablet by mouth.    [provider]  estradiol-norethindrone (ACTIVELLA) 1-0.5 MG tablet Take 1 tablet by mouth daily.    [provider]  levothyroxine (SYNTHROID, LEVOTHROID) 88 MCG tablet Take 88 mcg by mouth daily before breakfast.    [provider]  Multiple Vitamin (MULTIVITAMIN WITH MINERALS) TABS tablet Take 1 tablet by mouth daily.    [provider]  vitamin C (ASCORBIC ACID) 500 MG tablet Take 500 mg by mouth daily.    [provider]      Allergies    Patient has no known allergies.    Review of Systems   Review of Systems  Physical Exam Updated Vital Signs BP (!) 153/94 (BP Location: Right Arm)   Pulse (!) 58   Temp 98 F (36.7 C) (Oral)   Resp 16   Ht 5\' 3"  (1.6 m)   Wt 52.2 kg   SpO2 100%   BMI 20.37 kg/m  Physical Exam  ED Results / Procedures / Treatments   Labs (all labs ordered are listed, but only abnormal results are displayed) Labs Reviewed - No data to display  EKG None  Radiology No results found.  Procedures Procedures  {Document cardiac monitor, telemetry assessment procedure when appropriate:1}  Medications Ordered in ED Medications - No data to display  ED Course/ Medical Decision  Making/ A&P                           Medical Decision Making  ***  {Document critical care time when appropriate:1} {Document review of labs and clinical decision tools ie heart score, Chads2Vasc2 etc:1}  {Document your independent review of radiology images, and any outside records:1} {Document your discussion with family members, caretakers, and with consultants:1} {Document social determinants of health affecting pt's care:1} {Document your decision making why or why not admission, treatments were needed:1} Final Clinical Impression(s) / ED Diagnoses Final diagnoses:  None    Rx / DC Orders ED Discharge Orders     None

## 2021-10-01 ENCOUNTER — Ambulatory Visit
Admission: RE | Admit: 2021-10-01 | Discharge: 2021-10-01 | Disposition: A | Discharge status: Home / Self Care | Payer: PPO | Source: Ambulatory Visit | Attending: Family Medicine | Admitting: Family Medicine

## 2021-10-01 DIAGNOSIS — Z1231 Encounter for screening mammogram for malignant neoplasm of breast: Secondary | ICD-10-CM

## 2022-09-14 ENCOUNTER — Other Ambulatory Visit: Payer: Self-pay | Admitting: Family Medicine

## 2022-09-14 DIAGNOSIS — Z1231 Encounter for screening mammogram for malignant neoplasm of breast: Secondary | ICD-10-CM

## 2022-10-05 ENCOUNTER — Ambulatory Visit: Admission: RE | Admit: 2022-10-05 | Payer: PPO | Source: Ambulatory Visit

## 2022-10-05 DIAGNOSIS — Z1231 Encounter for screening mammogram for malignant neoplasm of breast: Secondary | ICD-10-CM

## 2023-01-25 IMAGING — MG MM DIGITAL SCREENING BILAT W/ TOMO AND CAD
8 series · 9 of 24 positions shown · non-contrast
Comparison: Previous exam(s).

CLINICAL DATA: Screening.

EXAM:
DIGITAL SCREENING BILATERAL MAMMOGRAM WITH TOMOSYNTHESIS AND CAD
TECHNIQUE: Bilateral screening digital craniocaudal and mediolateral oblique
mammograms were obtained. Bilateral screening digital breast
tomosynthesis was performed. The images were evaluated with
computer-aided detection.

[R CC synth-2D]
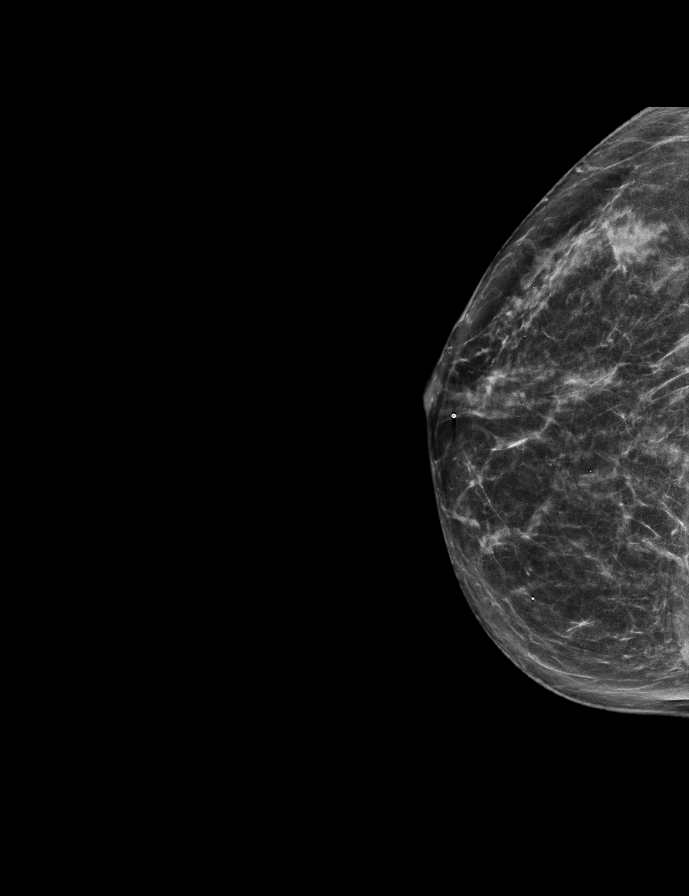

[L CC synth-2D]
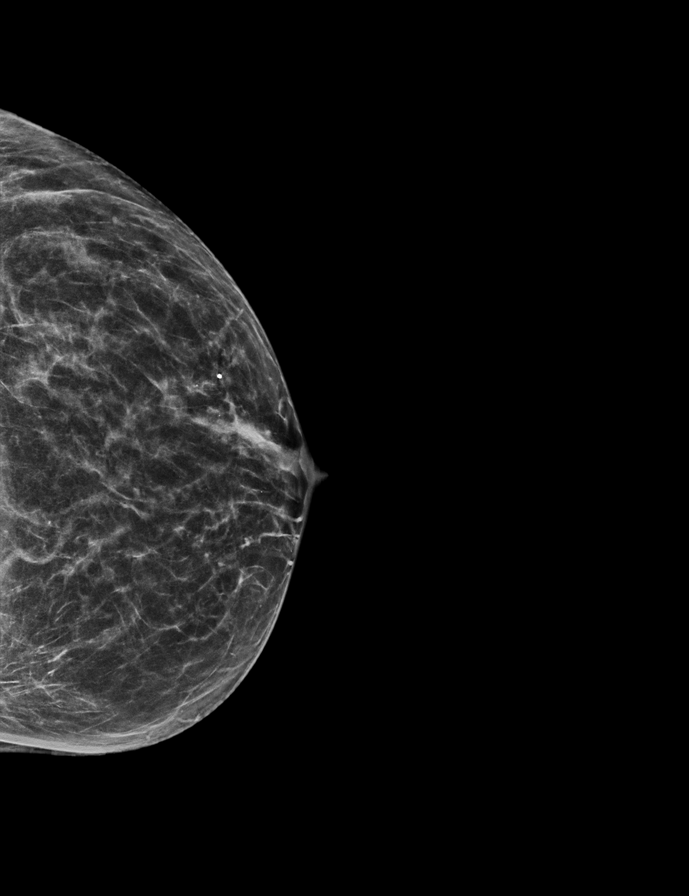

[R MLO synth-2D]
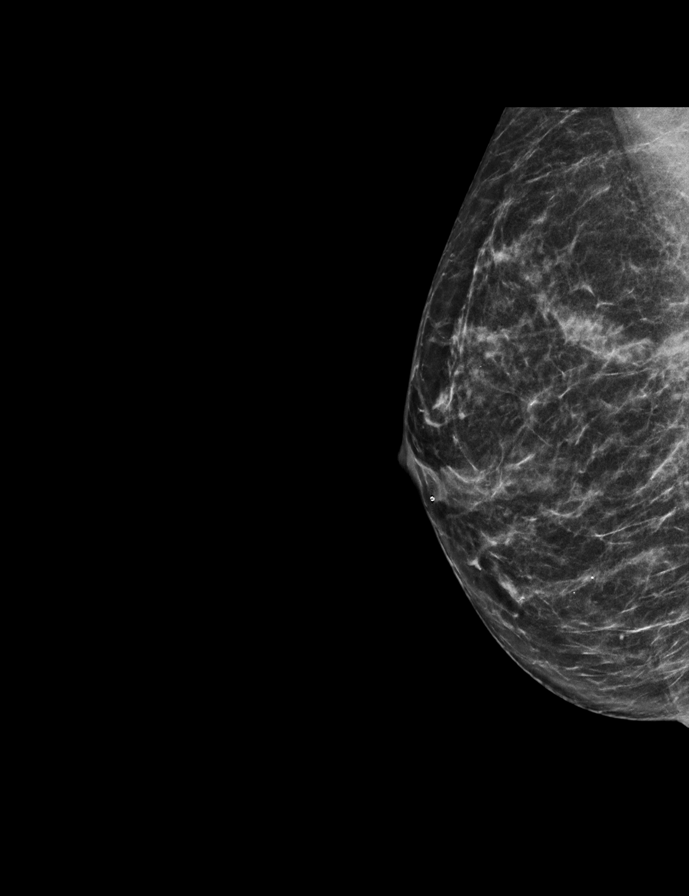

[L MLO synth-2D]
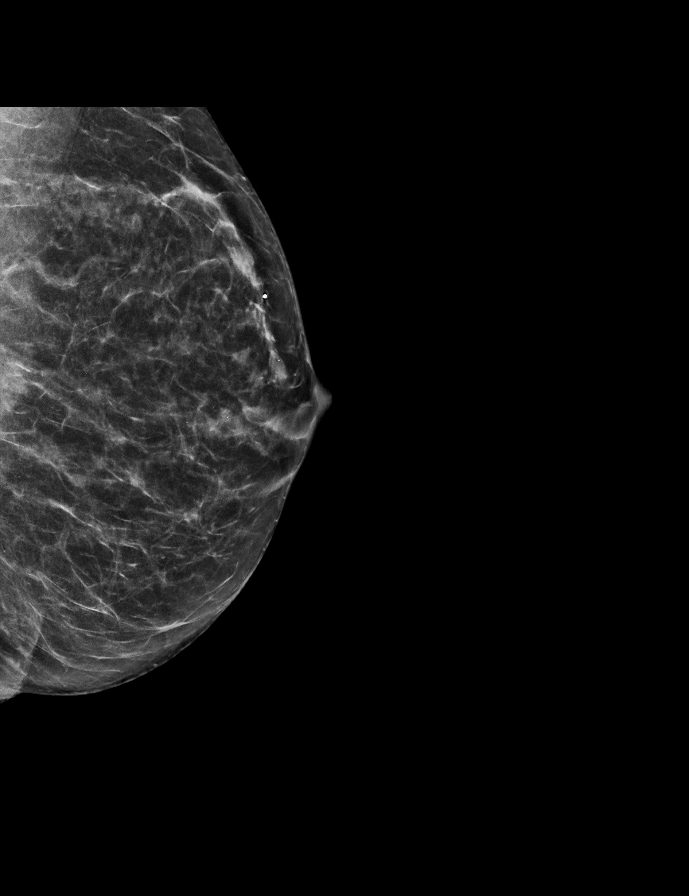

[R CC tomo · 2 of 57 frames shown]
[frame 19/57]
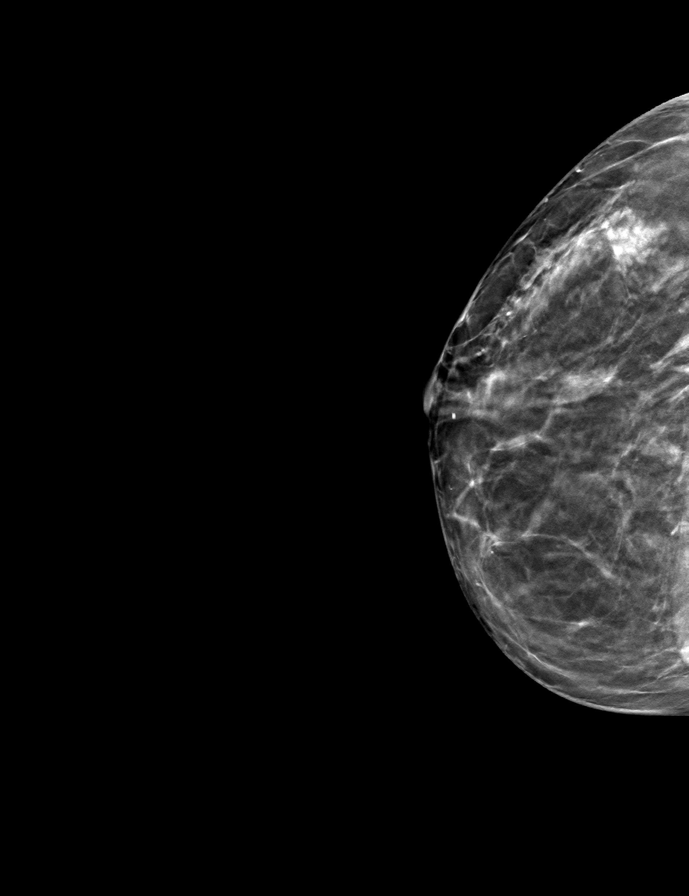
[frame 29/57]
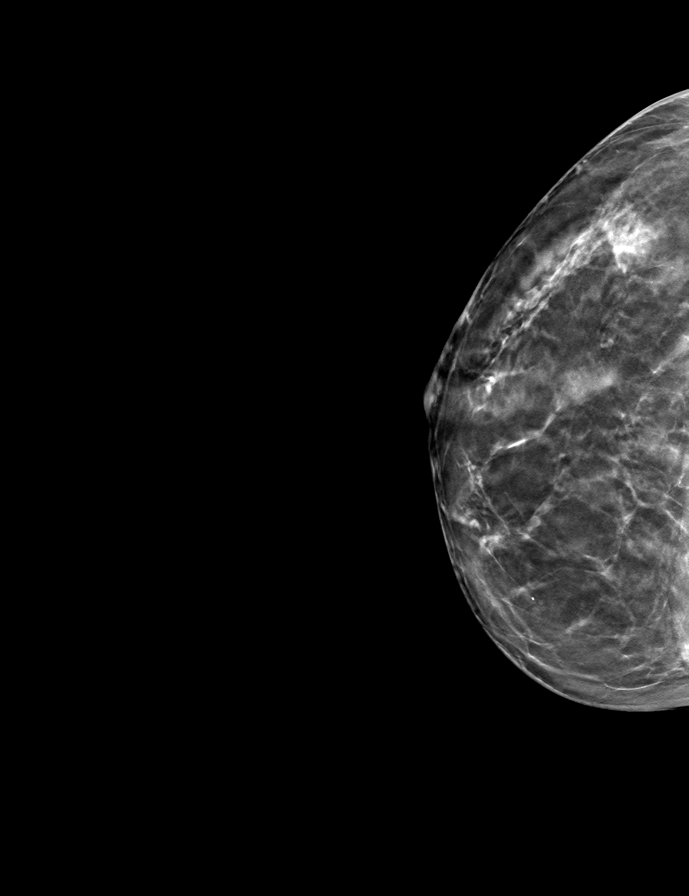

[R MLO tomo · tomo slice 25/50.0]
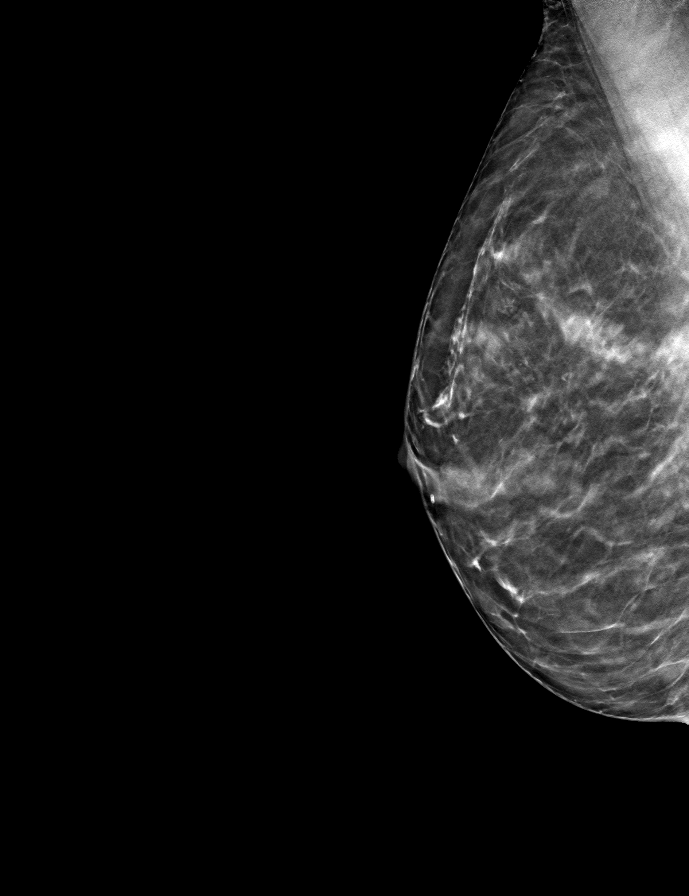

[L CC tomo · tomo slice 27/54.0]
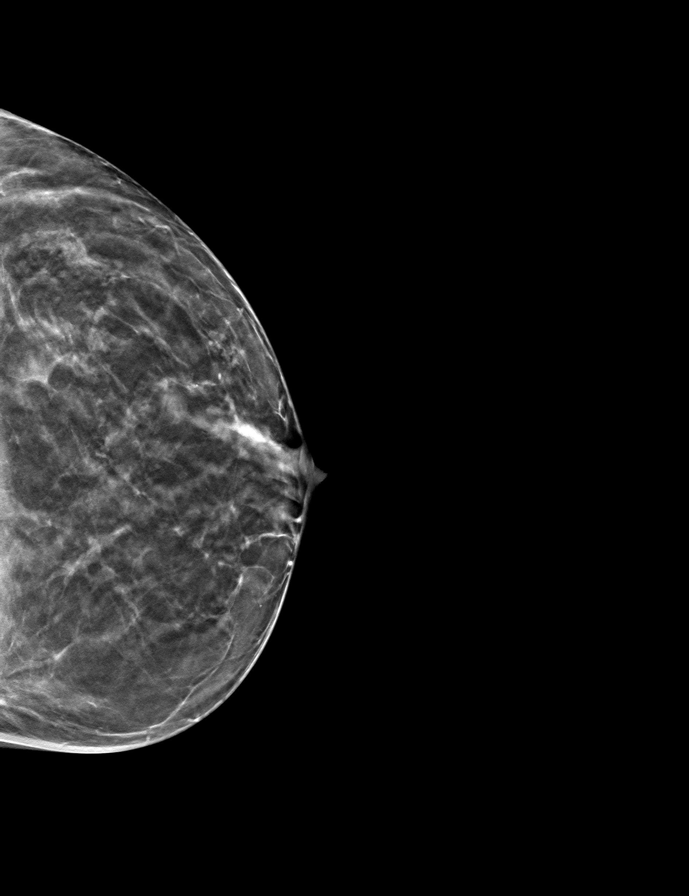

[L MLO tomo · tomo slice 27/52.0]
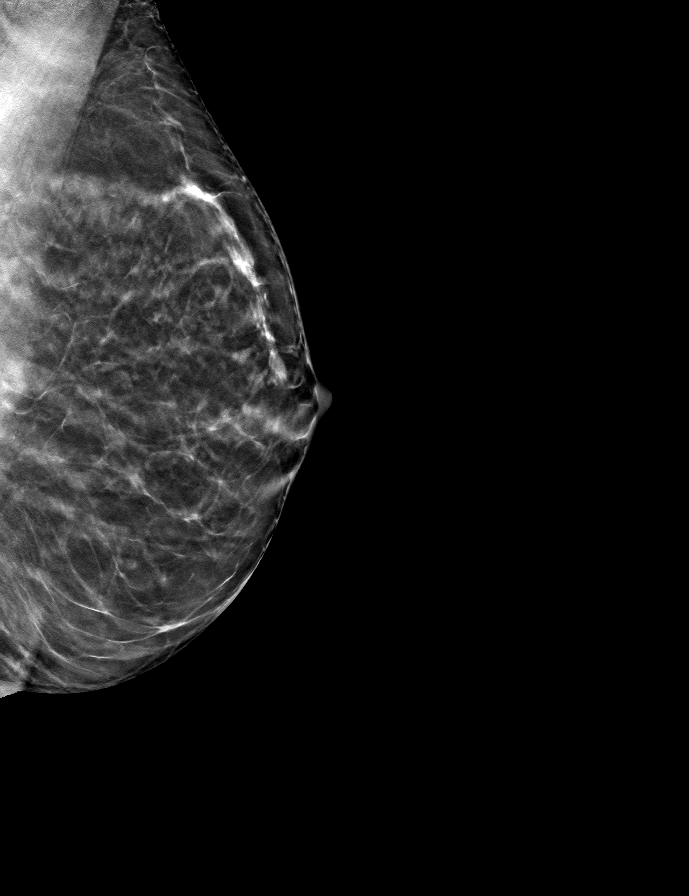

[9 of 24 positions shown; findings below may reference images not displayed]

ACR Breast Density Category b: There are scattered areas of
fibroglandular density.
FINDINGS: There are no findings suspicious for malignancy.
IMPRESSION: No mammographic evidence of malignancy. A result letter of this
screening mammogram will be mailed directly to the patient.

RECOMMENDATION:
Screening mammogram in one year. (Code:51-O-LD2)

BI-RADS CATEGORY  1: Negative.

## 2023-03-10 ENCOUNTER — Other Ambulatory Visit: Payer: Self-pay | Admitting: Family Medicine

## 2023-03-10 DIAGNOSIS — Z1231 Encounter for screening mammogram for malignant neoplasm of breast: Secondary | ICD-10-CM

## 2023-03-10 DIAGNOSIS — M8589 Other specified disorders of bone density and structure, multiple sites: Secondary | ICD-10-CM

## 2023-10-10 ENCOUNTER — Ambulatory Visit
Admission: RE | Admit: 2023-10-10 | Discharge: 2023-10-10 | Disposition: A | Discharge status: Home / Self Care | Source: Ambulatory Visit | Attending: Family Medicine | Admitting: Family Medicine

## 2023-10-10 DIAGNOSIS — Z1231 Encounter for screening mammogram for malignant neoplasm of breast: Secondary | ICD-10-CM

## 2023-11-01 ENCOUNTER — Other Ambulatory Visit: Payer: PPO

## 2023-12-05 ENCOUNTER — Ambulatory Visit (HOSPITAL_BASED_OUTPATIENT_CLINIC_OR_DEPARTMENT_OTHER)
Admission: RE | Admit: 2023-12-05 | Discharge: 2023-12-05 | Disposition: A | Discharge status: Home / Self Care | Source: Ambulatory Visit | Attending: Family Medicine | Admitting: Family Medicine

## 2023-12-05 DIAGNOSIS — M8589 Other specified disorders of bone density and structure, multiple sites: Secondary | ICD-10-CM | POA: Insufficient documentation

## 2024-02-15 ENCOUNTER — Ambulatory Visit: Attending: Cardiology | Admitting: Cardiology

## 2024-02-15 ENCOUNTER — Encounter: Payer: Self-pay | Admitting: Cardiology

## 2024-02-15 VITALS — BP 123/79 | HR 60 | Ht 63.0 in | Wt 115.0 lb

## 2024-02-15 DIAGNOSIS — R072 Precordial pain: Secondary | ICD-10-CM | POA: Diagnosis not present

## 2024-02-15 NOTE — Progress Notes (Signed)
 Cardiology Office Note:  .   Date:  02/15/2024  ID:  Joyce Bass, DOB 06/24/1955, MRN [id number] PCP: Leila Lucie LABOR, MD  [facility] Providers Cardiologist:  None    History of Present Illness: .   Joyce Bass is a 68 y.o. female Discussed the use of AI scribe  History of Present Illness Joyce Bass is a 68 year old female with coronary artery disease who presents for evaluation of elevated coronary artery calcium score.  She is currently on atorvastatin 80 mg daily, which was increased from 10 mg to 20 mg, and then to 80 mg following her calcium score results. Coronary artery calcification with a score of 259, placing her in the 81st percentile for her age. Her LDL cholesterol was recorded at 106 mg/dL, and her total cholesterol was 218 mg/dL on a 20 mg dose of atorvastatin.  She has a family history of coronary artery disease; her father underwent quintuple bypass surgery at age 68, (patient of ours) and her mother had hyperlipidemia and Alzheimer's disease.  No shortness of breath or chest pain, but she notes a decrease in exercise tolerance, stating it 'took longer to get aerobic' when running a 10K.    Enjoys horses, farm in [place]  Studies Reviewed: SABRA   EKG Interpretation Date/Time:  Wednesday February 15 2024 11:29:39 EST Ventricular Rate:  60 PR Interval:  164 QRS Duration:  74 QT Interval:  386 QTC Calculation: 386 R Axis:   60  Text Interpretation: Normal sinus rhythm Normal ECG No previous ECGs available Confirmed by Jeffrie Anes ([id number]) on 02/15/2024 11:44:54 AM    Results LABS LDL: 106 (11/28/2023) Total cholesterol: 218 (11/18/2023)  RADIOLOGY Coronary artery calcification score: 259, 81st percentile (11/28/2023)  DIAGNOSTIC EKG: Normal Risk Assessment/Calculations:            Physical Exam:   VS:  BP 123/79   Pulse 60   Ht 5' 3 (1.6 m)   Wt 115 lb (52.2 kg)   SpO2 97%   BMI 20.37 kg/m    Wt Readings from Last  3 Encounters:  02/15/24 115 lb (52.2 kg)  08/28/21 115 lb (52.2 kg)  06/22/18 114 lb (51.7 kg)    GEN: Well nourished, well developed in no acute distress NECK: No JVD; No carotid bruits CARDIAC: RRR, no murmurs, no rubs, no gallops RESPIRATORY:  Clear to auscultation without rales, wheezing or rhonchi  ABDOMEN: Soft, non-tender, non-distended EXTREMITIES:  No edema; No deformity   ASSESSMENT AND PLAN: .    Assessment and Plan Assessment & Plan Coronary artery calcification with hyperlipidemia and decreased exercise tolerance Coronary artery calcification with a calcium score of 259, placing her in the 81st percentile, indicating a higher risk profile. LDL is 108, above the target of less than 70. Family history includes father's quintuple bypass and mother's hyperlipidemia and Alzheimer's. Decreased exercise tolerance may be related to coronary artery disease. No current symptoms of chest pain or shortness of breath. Discussed potential for plaque regression with lower LDL levels and the role of statins and Repatha in stabilizing plaque. Consideration of LP(a) testing to further assess cardiovascular risk. Discussed the potential for coronary CT angiography to assess for stenosis or blockages, especially in the right coronary artery where the highest calcium score was noted. Explained that calcified plaque is less likely to rupture but can cause significant narrowing if thickened. Statins and Repatha may increase calcium scores as they stabilize soft plaque. - Ordered coronary CT angiography with  contrast to assess for stenosis or blockages. - Continue atorvastatin 80 mg daily. - Will consider adding ezetimibe or Repatha if LDL goals are not met. - Will discuss LP(a) testing to further assess cardiovascular risk. - Scheduled follow-up in one year.         Dispo: We will follow-up with results of testing  Signed, Oneil Parchment, MD

## 2024-02-15 NOTE — Patient Instructions (Signed)
 Medication Instructions:  The current medical regimen is effective;  continue present plan and medications.  *If you need a refill on your cardiac medications before your next appointment, please call your pharmacy*  Testing/Procedures:   Your cardiac CT will be scheduled at: [facility] [address]  [place], [place] [address]  Please enter the parking lot using the [address] entrance and use the FREE valet service at the patient drop-off area. Enter the building and check-in with registration on the main floor.  Please follow these instructions carefully (unless otherwise directed):  An IV will be required for this test and Nitroglycerin will be given.  Hold all erectile dysfunction medications at least 3 days (72 hrs) prior to test. (Ie viagra, cialis, sildenafil, tadalafil, etc)   On the Night Before the Test: Be sure to Drink plenty of water. Do not consume any caffeinated/decaffeinated beverages or chocolate 12 hours prior to your test. Do not take any antihistamines 12 hours prior to your test.  On the Day of the Test: Drink plenty of water until 1 hour prior to the test. Do not eat any food 1 hour prior to test. You may take your regular medications prior to the test.  Take metoprolol (Lopressor) two hours prior to test. If you take Furosemide/Hydrochlorothiazide/Spironolactone/Chlorthalidone, please HOLD on the morning of the test. Patients who wear a continuous glucose monitor MUST remove the device prior to scanning. FEMALES- please wear underwire-free bra if available, avoid dresses & tight clothing  After the Test: Drink plenty of water. After receiving IV contrast, you may experience a mild flushed feeling. This is normal. On occasion, you may experience a mild rash up to 24 hours after the test. This is not dangerous. If this occurs, you can take Benadryl 25 mg, Zyrtec, Claritin, or Allegra and increase your fluid intake.  (Patients taking Tikosyn should avoid Benadryl, and may take Zyrtec, Claritin, or Allegra) If you experience trouble breathing, this can be serious. If it is severe call 911 IMMEDIATELY. If it is mild, please call our office.  We will call to schedule your test 2-4 weeks out understanding that some insurance companies will need an authorization prior to the service being performed.   For more information and frequently asked questions, please visit our website : [email or web address]  For non-scheduling related questions, please contact the cardiac imaging nurse navigator should you have any questions/concerns: Cardiac Imaging Nurse Navigators Direct Office Dial: [phone]   For scheduling needs, including cancellations and rescheduling, please call Brittany, [phone].   Follow-Up: At [facility], you and your health needs are our priority.  As part of our continuing mission to provide you with exceptional heart care, our providers are all part of one team.  This team includes your primary Cardiologist (physician) and Advanced Practice Providers or APPs (Physician Assistants and Nurse Practitioners) who all work together to provide you with the care you need, when you need it.  Your next appointment:   1 year(s)  Provider:   Dr Oneil Parchment.    We recommend signing up for the patient portal called MyChart.  Sign up information is provided on this After Visit Summary.  MyChart is used to connect with patients for Virtual Visits (Telemedicine).  Patients are able to view lab/test results, encounter notes, upcoming appointments, etc.  Non-urgent messages can be sent to your provider as well.   To learn more about what you can do with MyChart, go to [email or web address].

## 2024-02-22 ENCOUNTER — Encounter (HOSPITAL_COMMUNITY): Payer: Self-pay

## 2024-02-24 ENCOUNTER — Ambulatory Visit (HOSPITAL_COMMUNITY): Admission: RE | Admit: 2024-02-24 | Discharge: 2024-02-24 | Attending: Cardiology | Admitting: Cardiology

## 2024-02-24 DIAGNOSIS — R072 Precordial pain: Secondary | ICD-10-CM

## 2024-02-24 MED ORDER — NITROGLYCERIN 0.4 MG SL SUBL
0.8000 mg | SUBLINGUAL_TABLET | Freq: Once | SUBLINGUAL | Status: AC
  Administered 2024-02-24: 0.8 mg via SUBLINGUAL

## 2024-02-24 MED ORDER — IOHEXOL 350 MG/ML SOLN
100.0000 mL | Freq: Once | INTRAVENOUS | Status: AC | PRN
  Administered 2024-02-24: 100 mL via INTRAVENOUS

## 2024-03-02 ENCOUNTER — Ambulatory Visit: Payer: Self-pay | Admitting: Cardiology

## 2024-03-02 DIAGNOSIS — Z79899 Other long term (current) drug therapy: Secondary | ICD-10-CM

## 2024-03-02 DIAGNOSIS — R072 Precordial pain: Secondary | ICD-10-CM

## 2024-03-02 DIAGNOSIS — R931 Abnormal findings on diagnostic imaging of heart and coronary circulation: Secondary | ICD-10-CM

## 2024-03-06 ENCOUNTER — Other Ambulatory Visit: Payer: Self-pay | Admitting: *Deleted

## 2024-03-06 DIAGNOSIS — R9389 Abnormal findings on diagnostic imaging of other specified body structures: Secondary | ICD-10-CM

## 2024-03-16 LAB — LIPID PANEL
Chol/HDL Ratio: 2.1 ratio (ref 0.0–4.4)
Cholesterol, Total: 206 mg/dL — ABNORMAL HIGH (ref 100–199)
HDL: 100 mg/dL
LDL Chol Calc (NIH): 97 mg/dL (ref 0–99)
Triglycerides: 49 mg/dL (ref 0–149)
VLDL Cholesterol Cal: 9 mg/dL (ref 5–40)

## 2024-03-19 NOTE — Addendum Note (Signed)
 Addended by: VICCI ROXIE CROME on: 03/19/2024 08:31 AM   Modules accepted: Orders

## 2024-03-22 NOTE — Progress Notes (Signed)
 Patient ID: Joyce Bass                 DOB: [birth year]                    MRN: [id number]      HPI: Joyce Bass is a 69 y.o. female patient referred to lipid clinic by Dr. Jeffrie. PMH is significant for HLD and hypothyroidism.  At the initial visit with Dr. Jeffrie on 02/15/2024, she was evaluated for an elevated coronary artery calcium  score. Follow-up CAC score of 298 (88th percentile) on 02/24/2024. She was continued on atorvastatin  80 mg daily and given an LDL goal of <70 mg/dL.   The most recent lipid panel on 03/16/2024 showed TGs at 49, LDL at 97, and HDL at 100.   At today's visit, she states that she has been on atorvastatin  80 mg daily for several months but has not seen a dramatic change in her LDL-C since increasing from 20 mg daily. She does report mild muscle pain and soreness since increasing her atorvastatin  to 80 mg.   Reviewed options for lowering LDL cholesterol, including ezetimibe and PCSK-9 inhibitors. Discussed mechanisms of action, dosing, side effects, and potential decreases in LDL cholesterol. Also reviewed cost information and potential options for patient assistance.   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, so lifts heavy material daily  Family History: Father (CABG x 4)  Social History:  Tobacco: transient use in college, none since EtOH: 1 drink per day, a total of 4 drinks per week  Labs: Lipid Panel     Component Value Date/Time   CHOL 206  (H) 03/16/2024 0949   TRIG 49 03/16/2024 0949   HDL 100 03/16/2024 0949   CHOLHDL 2.1 03/16/2024 0949   LDLCALC 97 03/16/2024 0949   LABVLDL 9 03/16/2024 0949    Past Medical History:  Diagnosis Date   Complication of anesthesia    Fibula fracture 04/21/2018   left-horse fell on leg   Hyperlipidemia    Hypothyroidism    hashimoto's   PONV (postoperative nausea and vomiting)     Medications Ordered Prior to Encounter[1]  Allergies[2]  Assessment/Plan:  1. Hyperlipidemia -  Hyperlipidemia Assessment:  LDL-C not at goal of <70 mg/dL She reports muscle pain and soreness after increasing her atorvastatin  dose from 20 mg to 80 mg Discussed current dietary habits and encouraged to reduce saturated fats  Plan:  DECREASE atorvastatin  40 mg daily START Repatha  140 mg every 14 days Collaborating with PA team to start process for Repatha . Will follow-up with patient via MyChart message once approval process is completed. She is aware that cost may be 100-150 per month and she is ok with this. Counseled on saturated fats and encouraged avoiding them in her diet Ordered labs for her to recheck lipids in 2-3 months. Will follow-up after lab results.    Thank you,  Venda Dice D Tovia Kisner, Pharm.JONETTA SARAN, CPP [facility] A Division of  [facility] [address] [place], [place] [address]  Phone: [phone]; Fax: [phone]      [1]  Current Outpatient Medications on File Prior to Visit  Medication Sig Dispense Refill   atorvastatin  (LIPITOR) 80 MG tablet Take 80 mg by mouth daily.     calcium -vitamin D (OSCAL WITH D) 500-200 MG-UNIT tablet Take 1 tablet by mouth.     ibuprofen (ADVIL) 200 MG tablet Take 400 mg by mouth daily.     levothyroxine (SYNTHROID, LEVOTHROID) 88 MCG tablet Take 88 mcg by mouth daily before breakfast.     Multiple Vitamin (MULTIVITAMIN WITH MINERALS) TABS tablet Take 1 tablet by mouth daily.     Hormone Cream Base (HRT  CREAM BASE) CREA Place onto the skin.     No current facility-administered medications on file prior to visit.  [2] No Known Allergies

## 2024-03-23 ENCOUNTER — Ambulatory Visit: Attending: Cardiology | Admitting: Pharmacist

## 2024-03-23 DIAGNOSIS — E785 Hyperlipidemia, unspecified: Secondary | ICD-10-CM | POA: Insufficient documentation

## 2024-03-23 DIAGNOSIS — E782 Mixed hyperlipidemia: Secondary | ICD-10-CM | POA: Diagnosis not present

## 2024-03-23 NOTE — Assessment & Plan Note (Addendum)
 Assessment:  LDL-C not at goal of <70 mg/dL She reports muscle pain and soreness after increasing her atorvastatin  dose from 20 mg to 80 mg Discussed current dietary habits and encouraged to reduce saturated fats  Plan:  DECREASE atorvastatin  40 mg daily START Repatha  140 mg every 14 days Collaborating with PA team to start process for Repatha . Will follow-up with patient via MyChart message once approval process is completed. She is aware that cost may be 100-150 per month and she is ok with this. Counseled on saturated fats and encouraged avoiding them in her diet Ordered labs for her to recheck lipids in 2-3 months. Will follow-up after lab results.

## 2024-03-23 NOTE — Patient Instructions (Signed)

## 2024-03-26 ENCOUNTER — Encounter: Payer: Self-pay | Admitting: Pharmacist

## 2024-03-26 ENCOUNTER — Other Ambulatory Visit (HOSPITAL_COMMUNITY): Payer: Self-pay

## 2024-03-26 ENCOUNTER — Telehealth: Payer: Self-pay | Admitting: Pharmacy Technician

## 2024-03-26 MED ORDER — REPATHA SURECLICK 140 MG/ML ~~LOC~~ SOAJ: 1.0000 mL | SUBCUTANEOUS | 3 refills | Status: AC

## 2024-03-26 NOTE — Telephone Encounter (Signed)
 Pharmacy Patient Advocate Encounter  Received notification from [employer] that Prior Authorization for repatha  has been APPROVED from 03/26/24 to 09/22/24. Ran test claim, Copay is $112.07  one month. This test claim was processed through [facility]- copay amounts may vary at other pharmacies due to pharmacy/plan contracts, or as the patient moves through the different stages of their insurance plan.   PA #/Case ID/Reference #: [id number]

## 2024-03-26 NOTE — Telephone Encounter (Addendum)
 Pharmacy Patient Advocate Encounter   Received notification from Physician's Office - eleanor that prior authorization for repatha  is required/requested.   Insurance verification completed.   The patient is insured through [facility] [employer].   Per test claim: PA required; PA submitted to above mentioned insurance via Latent Key/confirmation #/EOC [id number] Status is pending

## 2024-03-26 NOTE — Addendum Note (Signed)
 Addended by: Chancy Smigiel D on: 03/26/2024 03:36 PM   Modules accepted: Orders

## 2024-03-27 MED ORDER — ATORVASTATIN CALCIUM 40 MG PO TABS: 40.0000 mg | ORAL_TABLET | Freq: Every day | ORAL | 3 refills | Status: AC

## 2024-03-27 NOTE — Addendum Note (Signed)
 Addended by: Melanye Hiraldo D on: 03/27/2024 11:36 AM   Modules accepted: Orders

## 2024-04-11 ENCOUNTER — Telehealth: Payer: Self-pay | Admitting: Emergency Medicine

## 2024-04-11 NOTE — Telephone Encounter (Signed)
 Spoke with patient briefly regarding a liver US  that was never scheduled. Liver US  from our office are not dropped into [employer] work queue. Pt must call to schedule that US . She stated she is in [place]  until March and requests that the number be sent via mychart so she can schedule it when she returns to [facility]. Message sent today.

## 2024-05-28 ENCOUNTER — Other Ambulatory Visit (HOSPITAL_BASED_OUTPATIENT_CLINIC_OR_DEPARTMENT_OTHER)
